# Patient Record
Sex: Female | Born: 1980 | Race: White | Hispanic: No | Marital: Married | Smoking: Former smoker
Health system: Southern US, Community
[De-identification: ages and names within clinical notes are randomized; demographics above are authoritative.]

## PROBLEM LIST (undated history)

## (undated) DIAGNOSIS — R160 Hepatomegaly, not elsewhere classified: Secondary | ICD-10-CM

## (undated) DIAGNOSIS — D649 Anemia, unspecified: Secondary | ICD-10-CM

## (undated) DIAGNOSIS — M47816 Spondylosis without myelopathy or radiculopathy, lumbar region: Secondary | ICD-10-CM

## (undated) DIAGNOSIS — M1990 Unspecified osteoarthritis, unspecified site: Secondary | ICD-10-CM

## (undated) DIAGNOSIS — D509 Iron deficiency anemia, unspecified: Secondary | ICD-10-CM

## (undated) DIAGNOSIS — Z8616 Personal history of COVID-19: Secondary | ICD-10-CM

## (undated) DIAGNOSIS — K219 Gastro-esophageal reflux disease without esophagitis: Secondary | ICD-10-CM

## (undated) DIAGNOSIS — Z9889 Other specified postprocedural states: Secondary | ICD-10-CM

## (undated) DIAGNOSIS — E282 Polycystic ovarian syndrome: Secondary | ICD-10-CM

## (undated) DIAGNOSIS — F17200 Nicotine dependence, unspecified, uncomplicated: Secondary | ICD-10-CM

## (undated) DIAGNOSIS — Z9289 Personal history of other medical treatment: Secondary | ICD-10-CM

## (undated) DIAGNOSIS — F419 Anxiety disorder, unspecified: Secondary | ICD-10-CM

## (undated) HISTORY — PX: TONSILECTOMY, ADENOIDECTOMY, BILATERAL MYRINGOTOMY AND TUBES: SHX2538

## (undated) HISTORY — PX: TONSILLECTOMY AND ADENOIDECTOMY: SHX28

## (undated) HISTORY — PX: CARPAL TUNNEL RELEASE: SHX101

## (undated) HISTORY — PX: VENTRAL HERNIA REPAIR: SHX424

## (undated) HISTORY — PX: APPENDECTOMY: SHX54

---

## 2010-10-18 DIAGNOSIS — Z8614 Personal history of Methicillin resistant Staphylococcus aureus infection: Secondary | ICD-10-CM

## 2010-10-18 HISTORY — DX: Personal history of Methicillin resistant Staphylococcus aureus infection: Z86.14

## 2018-02-14 ENCOUNTER — Encounter: Payer: Self-pay | Admitting: Emergency Medicine

## 2018-02-14 ENCOUNTER — Observation Stay
Admission: EM | Admit: 2018-02-14 | Discharge: 2018-02-15 | Disposition: A | Discharge status: Home / Self Care | Payer: BLUE CROSS/BLUE SHIELD | Attending: Internal Medicine | Admitting: Internal Medicine

## 2018-02-14 ENCOUNTER — Other Ambulatory Visit: Payer: Self-pay

## 2018-02-14 DIAGNOSIS — W5501XA Bitten by cat, initial encounter: Secondary | ICD-10-CM | POA: Diagnosis not present

## 2018-02-14 DIAGNOSIS — Y9389 Activity, other specified: Secondary | ICD-10-CM | POA: Insufficient documentation

## 2018-02-14 DIAGNOSIS — F17210 Nicotine dependence, cigarettes, uncomplicated: Secondary | ICD-10-CM | POA: Insufficient documentation

## 2018-02-14 DIAGNOSIS — L03011 Cellulitis of right finger: Secondary | ICD-10-CM | POA: Diagnosis not present

## 2018-02-14 DIAGNOSIS — S61258A Open bite of other finger without damage to nail, initial encounter: Secondary | ICD-10-CM

## 2018-02-14 DIAGNOSIS — S61250A Open bite of right index finger without damage to nail, initial encounter: Secondary | ICD-10-CM | POA: Diagnosis not present

## 2018-02-14 DIAGNOSIS — L089 Local infection of the skin and subcutaneous tissue, unspecified: Secondary | ICD-10-CM | POA: Diagnosis present

## 2018-02-14 DIAGNOSIS — Z23 Encounter for immunization: Secondary | ICD-10-CM | POA: Diagnosis not present

## 2018-02-14 HISTORY — DX: Nicotine dependence, unspecified, uncomplicated: F17.200

## 2018-02-14 NOTE — ED Triage Notes (Addendum)
Patient ambulatory to triage with steady gait, without difficulty or distress noted; pt reports her cat bit her left finger at noon while attempting to get her out of storm drain

## 2018-02-15 ENCOUNTER — Emergency Department: Payer: BLUE CROSS/BLUE SHIELD

## 2018-02-15 ENCOUNTER — Encounter: Payer: Self-pay | Admitting: Internal Medicine

## 2018-02-15 ENCOUNTER — Other Ambulatory Visit: Payer: Self-pay

## 2018-02-15 DIAGNOSIS — W5501XA Bitten by cat, initial encounter: Secondary | ICD-10-CM

## 2018-02-15 DIAGNOSIS — F17200 Nicotine dependence, unspecified, uncomplicated: Secondary | ICD-10-CM | POA: Insufficient documentation

## 2018-02-15 DIAGNOSIS — S61258A Open bite of other finger without damage to nail, initial encounter: Secondary | ICD-10-CM

## 2018-02-15 DIAGNOSIS — L089 Local infection of the skin and subcutaneous tissue, unspecified: Secondary | ICD-10-CM | POA: Diagnosis present

## 2018-02-15 LAB — BASIC METABOLIC PANEL
Anion gap: 7 (ref 5–15)
BUN: 12 mg/dL (ref 6–20)
CALCIUM: 8.8 mg/dL — AB (ref 8.9–10.3)
CHLORIDE: 104 mmol/L (ref 101–111)
CO2: 25 mmol/L (ref 22–32)
CREATININE: 0.43 mg/dL — AB (ref 0.44–1.00)
GFR calc Af Amer: >60 mL/min (ref >60)
GFR calc non Af Amer: >60 mL/min (ref >60)
Glucose, Bld: 88 mg/dL (ref 65–99)
Potassium: 3.8 mmol/L (ref 3.5–5.1)
SODIUM: 136 mmol/L (ref 135–145)

## 2018-02-15 LAB — CBC WITH DIFFERENTIAL/PLATELET
BASOS PCT: 1 %
Basophils Absolute: 0 10*3/uL (ref 0–0.1)
EOS ABS: 0.1 10*3/uL (ref 0–0.7)
EOS PCT: 1 %
HCT: 39.6 % (ref 35.0–47.0)
HEMOGLOBIN: 13.7 g/dL (ref 12.0–16.0)
LYMPHS ABS: 2.1 10*3/uL (ref 1.0–3.6)
Lymphocytes Relative: 27 %
MCH: 32 pg (ref 26.0–34.0)
MCHC: 34.5 g/dL (ref 32.0–36.0)
MCV: 92.6 fL (ref 80.0–100.0)
MONO ABS: 0.6 10*3/uL (ref 0.2–0.9)
MONOS PCT: 8 %
Neutro Abs: 5 10*3/uL (ref 1.4–6.5)
Neutrophils Relative %: 63 %
PLATELETS: 266 10*3/uL (ref 150–440)
RBC: 4.28 MIL/uL (ref 3.80–5.20)
RDW: 13.7 % (ref 11.5–14.5)
WBC: 7.7 10*3/uL (ref 3.6–11.0)

## 2018-02-15 IMAGING — DX DG FINGER INDEX 2+V*R*
3 series · 3 of 3 positions shown · non-contrast
Comparison: None.

CLINICAL DATA: Cat bite

EXAM:
RIGHT INDEX FINGER 2+V

[finger ap]
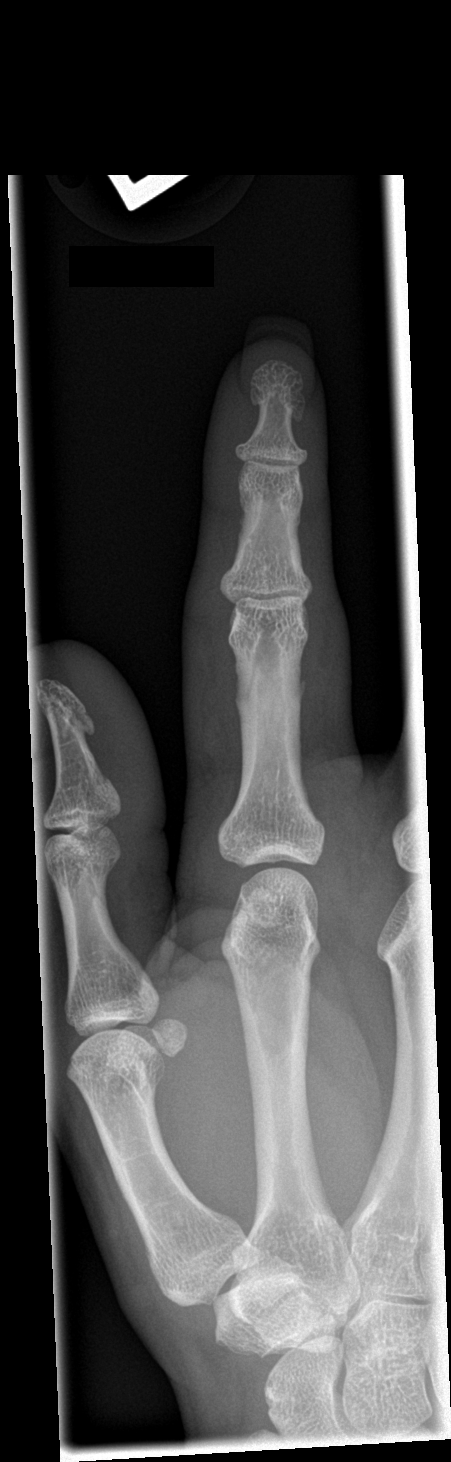

[finger obl]
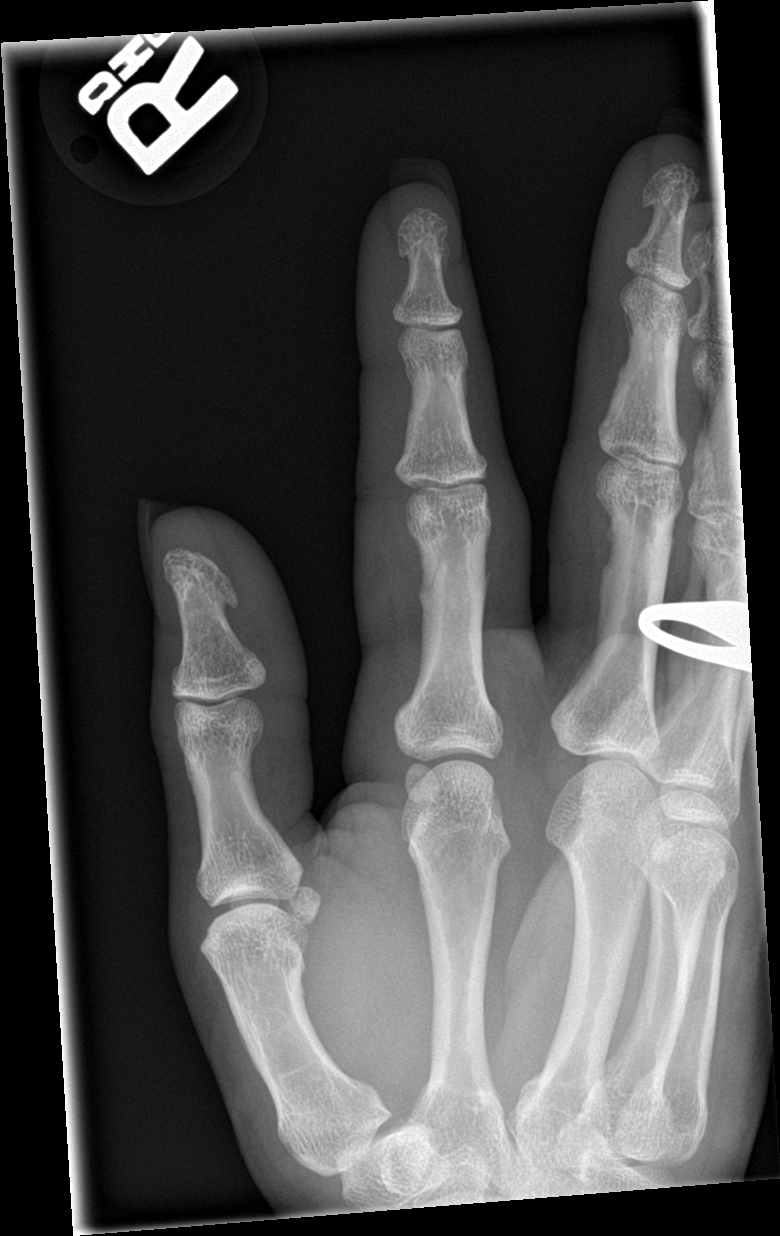

[finger lat]
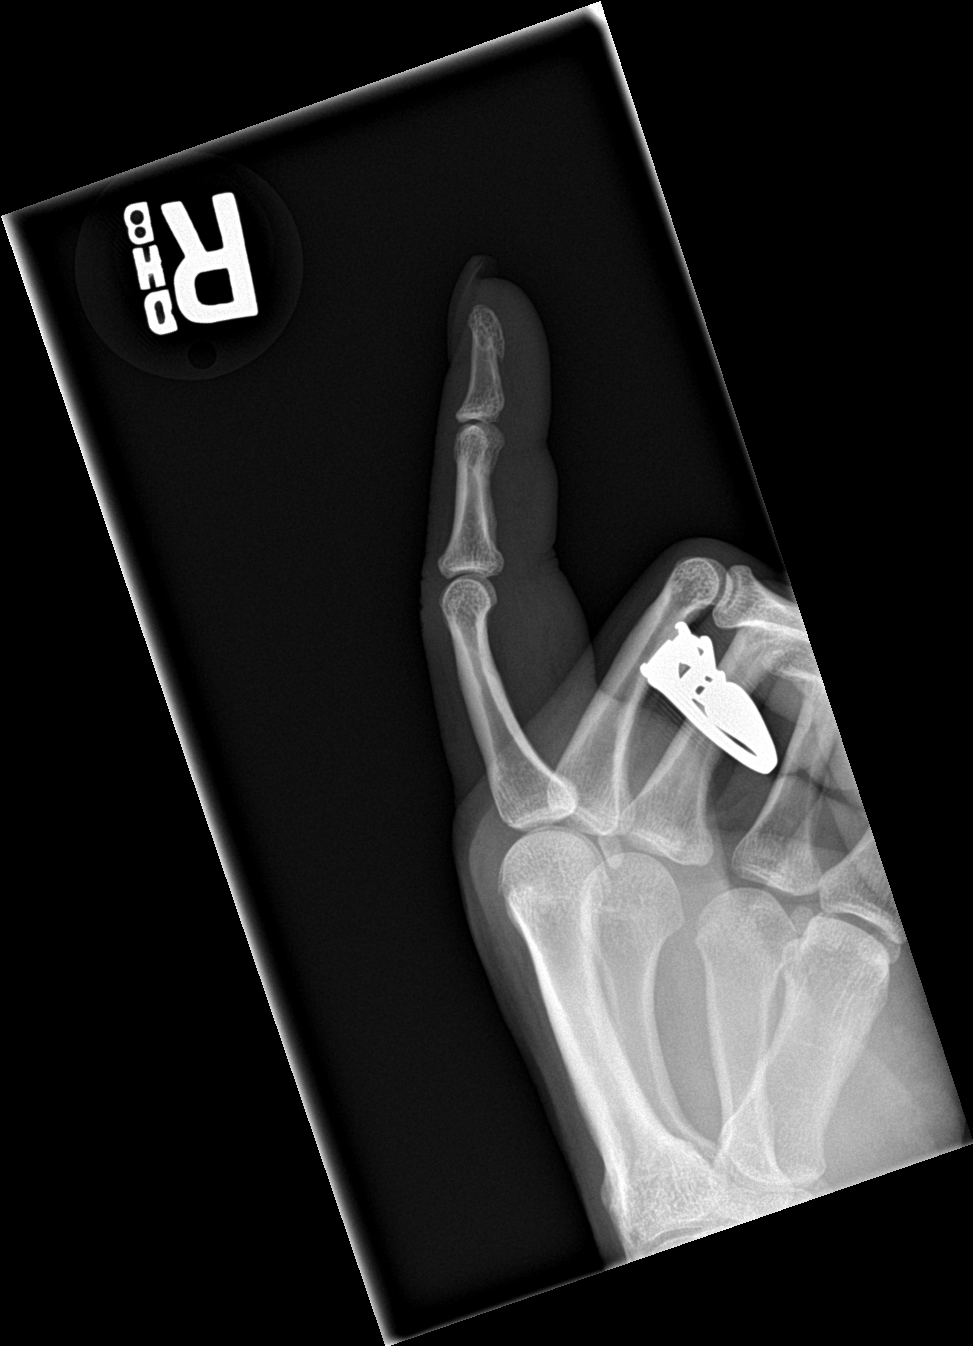

[3 of 3 positions shown; findings below may reference images not displayed]

FINDINGS: No fracture or malalignment. No radiopaque foreign body. Diffuse
soft tissue swelling
IMPRESSION: Diffuse soft tissue swelling.  No acute osseous abnormality

## 2018-02-15 MED ORDER — PIPERACILLIN-TAZOBACTAM 3.375 G IVPB
3.3750 g | Freq: Three times a day (TID) | INTRAVENOUS | Status: DC
  Administered 2018-02-15: 3.375 g via INTRAVENOUS
  Filled 2018-02-15: qty 50

## 2018-02-15 MED ORDER — SODIUM CHLORIDE 0.9 % IV SOLN
100.0000 mg | Freq: Two times a day (BID) | INTRAVENOUS | Status: DC
  Filled 2018-02-15 (×2): qty 100

## 2018-02-15 MED ORDER — IBUPROFEN 600 MG PO TABS
ORAL_TABLET | ORAL | Status: AC
  Filled 2018-02-15: qty 1

## 2018-02-15 MED ORDER — SENNOSIDES-DOCUSATE SODIUM 8.6-50 MG PO TABS: 1.0000 | ORAL_TABLET | Freq: Every evening | ORAL | Status: DC | PRN

## 2018-02-15 MED ORDER — IBUPROFEN 800 MG PO TABS
ORAL_TABLET | ORAL | Status: AC
  Filled 2018-02-15: qty 1

## 2018-02-15 MED ORDER — ACETAMINOPHEN 650 MG RE SUPP: 650.0000 mg | Freq: Four times a day (QID) | RECTAL | Status: DC | PRN

## 2018-02-15 MED ORDER — KETOROLAC TROMETHAMINE 15 MG/ML IJ SOLN
15.0000 mg | Freq: Three times a day (TID) | INTRAMUSCULAR | Status: DC | PRN
  Filled 2018-02-15: qty 1

## 2018-02-15 MED ORDER — ONDANSETRON HCL 4 MG PO TABS: 4.0000 mg | ORAL_TABLET | Freq: Four times a day (QID) | ORAL | Status: DC | PRN

## 2018-02-15 MED ORDER — PIPERACILLIN-TAZOBACTAM 3.375 G IVPB 30 MIN: 3.3750 g | Freq: Three times a day (TID) | INTRAVENOUS | Status: DC

## 2018-02-15 MED ORDER — DOXYCYCLINE HYCLATE 100 MG PO TABS
100.0000 mg | ORAL_TABLET | Freq: Two times a day (BID) | ORAL | Status: DC
  Administered 2018-02-15: 15:00:00 100 mg via ORAL
  Filled 2018-02-15 (×2): qty 1

## 2018-02-15 MED ORDER — DOXYCYCLINE HYCLATE 100 MG PO TABS
100.0000 mg | ORAL_TABLET | Freq: Once | ORAL | Status: AC
  Administered 2018-02-15: 100 mg via ORAL
  Filled 2018-02-15: qty 1

## 2018-02-15 MED ORDER — PIPERACILLIN-TAZOBACTAM 3.375 G IVPB 30 MIN
3.3750 g | Freq: Once | INTRAVENOUS | Status: AC
  Administered 2018-02-15: 3.375 g via INTRAVENOUS
  Filled 2018-02-15: qty 50

## 2018-02-15 MED ORDER — IBUPROFEN 600 MG PO TABS
600.0000 mg | ORAL_TABLET | Freq: Once | ORAL | Status: AC
  Administered 2018-02-15: 600 mg via ORAL

## 2018-02-15 MED ORDER — OXYCODONE-ACETAMINOPHEN 5-325 MG PO TABS: 1.0000 | ORAL_TABLET | ORAL | Status: DC | PRN

## 2018-02-15 MED ORDER — ACETAMINOPHEN 325 MG PO TABS: 650.0000 mg | ORAL_TABLET | Freq: Four times a day (QID) | ORAL | Status: DC | PRN

## 2018-02-15 MED ORDER — TETANUS-DIPHTH-ACELL PERTUSSIS 5-2.5-18.5 LF-MCG/0.5 IM SUSP
0.5000 mL | Freq: Once | INTRAMUSCULAR | Status: AC
Start: 2018-02-15 — End: 2018-02-15
  Administered 2018-02-15: 0.5 mL via INTRAMUSCULAR
  Filled 2018-02-15: qty 0.5

## 2018-02-15 MED ORDER — AMOXICILLIN-POT CLAVULANATE 875-125 MG PO TABS: 1.0000 | ORAL_TABLET | Freq: Two times a day (BID) | ORAL | 0 refills | Status: AC

## 2018-02-15 MED ORDER — KETOROLAC TROMETHAMINE 30 MG/ML IJ SOLN: 30.0000 mg | Freq: Three times a day (TID) | INTRAMUSCULAR | Status: DC | PRN

## 2018-02-15 MED ORDER — ONDANSETRON HCL 4 MG/2ML IJ SOLN: 4.0000 mg | Freq: Four times a day (QID) | INTRAMUSCULAR | Status: DC | PRN

## 2018-02-15 MED ORDER — BISACODYL 5 MG PO TBEC: 5.0000 mg | DELAYED_RELEASE_TABLET | Freq: Every day | ORAL | Status: DC | PRN

## 2018-02-15 MED ORDER — OXYCODONE-ACETAMINOPHEN 5-325 MG PO TABS: 1.0000 | ORAL_TABLET | ORAL | 0 refills | Status: DC | PRN

## 2018-02-15 MED ORDER — ENOXAPARIN SODIUM 40 MG/0.4ML ~~LOC~~ SOLN: 40.0000 mg | SUBCUTANEOUS | Status: DC

## 2018-02-15 NOTE — H&P (Signed)
[facility] [place] [facility]   PATIENT NAME: Amy Maxwell    MR#:  [id number]  DATE OF BIRTH:  1981-08-25  DATE OF ADMISSION:  02/14/2018  PRIMARY CARE PHYSICIAN: Patient, No Pcp Per   REQUESTING/REFERRING PHYSICIAN: Loleta Rose, MD  CHIEF COMPLAINT:   Chief Complaint  Patient presents with  . Animal Bite    HISTORY OF PRESENT ILLNESS:  Amy Maxwell  is a 37 y.o. female with a known history of tobacco use D/O who p/w 1d Hx cat bite. Pt states that her female cat is an indoor rescue, and does not deal with stressful situations (i.e. noise, lights, unfamililar people/environments, outdoors, etc.) well. Her cat escaped the house on Sunday (02/12/2018). The pt was contacted by a neighbor, who had seen the cat in a storm drain. The pt and her husband attempted to get the cat out of the drain by having her husband stand on one end and bang on the drain grate to make noise, while she stood on the other end to prevent escape. The cat ran towards her, and she attempted to grab the cat, resulting in a bite to the base of the R index finger. She did manage to get the cat back inside the house. The cat is apparently up to date on all of her vaccinations. The pt sustained two small puncture wounds, one on either side of the proximal phalanx of the index finger of the R hand. The pt states she washed the bite and applied alcohol, but she noticed swelling over the course of the afternoon/evening. She denies purulent discharge, but endorses serosanguinous drainage. She endorses pain, TTP, and decreased AROM/PROM of the proximal interphalangeal joint of the R index finger (as well as pain w/ AROM/PROM). She denies fever, chills, diaphoresis, night sweats or rigors. She sent a picture of her finger to a family member who is a Engineer, civil (consulting), who advised pt to go to ED for IV ABx. Pt is healthy, well-appearing and in no acute distress, and is otherwise w/o complaint. She denies N/V/D/AP, CP,  SOB, cough, hemoptysis, wheezing, palpitations, HA, vertigo, blurred vision, LH/LOC, urinary symptoms. Pt is right-handed.  PAST MEDICAL HISTORY:   Past Medical History:  Diagnosis Date  . Tobacco use disorder      Tobacco use D/O.  PAST SURGICAL HISTORY:   Past Surgical History:  Procedure Laterality Date  . CARPAL TUNNEL RELEASE Right   . CESAREAN SECTION    . VENTRAL HERNIA REPAIR       C-section x3, L CTR, ventral hernia repair.  SOCIAL HISTORY:   Social History   Tobacco Use  . Smoking status: Current Every Day Smoker    Packs/day: 0.50    Years: 2.00    Pack years: 1.00    Types: Cigarettes  . Smokeless tobacco: Never Used  Substance Use Topics  . Alcohol use: Not Currently   (+) smoker, < 1/2ppd x~20yrs. (+) occasional social EtOH, (-) illicit drug use. (-) recent travel, (-) sick contacts. Lives in house w/ husband, 3 daughters, 4 cats, 1 dog. 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  FAMILY HISTORY:   Family History  Problem Relation Age of Onset  . Cirrhosis Father   . Alcoholism Father      Mother deceased of unknown causes. Father deceased 2/2 EtOH/cirrhosis. 6 brothers + 1 sister, alive & healthy. 3 daughters, alive & healthy.  DRUG ALLERGIES:  No Known Allergies  REVIEW OF SYSTEMS:   Review of  Systems  Constitutional: Negative for chills, diaphoresis, fever, malaise/fatigue and weight loss.  HENT: Negative for congestion, ear pain, hearing loss, nosebleeds, sinus pain, sore throat and tinnitus.   Eyes: Negative for blurred vision, double vision and photophobia.  Respiratory: Negative for cough, hemoptysis, sputum production, shortness of breath and wheezing.   Cardiovascular: Negative for chest pain, palpitations, orthopnea, claudication, leg swelling and PND.  Gastrointestinal: Negative for abdominal pain, blood in stool, constipation, diarrhea, heartburn, melena, nausea and vomiting.  Genitourinary: Negative for dysuria, flank pain, frequency,  hematuria and urgency.  Musculoskeletal: Positive for joint pain (+) pain at proximal interphalangeal joint of R index finger (bite location). Negative for back pain, falls, myalgias and neck pain.  Skin: Negative for itching and rash.  Neurological: Negative for dizziness, tingling, tremors, sensory change, speech change, focal weakness, seizures, loss of consciousness, weakness and headaches.    MEDICATIONS AT HOME:   Prior to Admission medications   Not on File      VITAL SIGNS:  Blood pressure 123/80, pulse 87, temperature 97.9 F (36.6 C), temperature source Oral, resp. rate 14, height  (1.626 m), weight 87.5 kg (193 lb), last menstrual period 01/25/2018, SpO2 96 %.  PHYSICAL EXAMINATION:  Physical Exam  Constitutional: She is oriented to person, place, and time. She appears well-developed and well-nourished. She is active and cooperative.  Non-toxic appearance. She does not have a sickly appearance. She does not appear ill. No distress.  HENT:  Head: Normocephalic and atraumatic.  Mouth/Throat: No oropharyngeal exudate.  Eyes: Pupils are equal, round, and reactive to light. Conjunctivae, EOM and lids are normal. No scleral icterus. Right pupil is round and reactive. Left pupil is round and reactive. Pupils are equal.  Neck: Normal range of motion. Neck supple. No JVD present. No thyromegaly present.  Cardiovascular: Normal rate, regular rhythm, S1 normal, S2 normal and normal heart sounds.  No extrasystoles are present. Exam reveals no gallop, no S3, no S4, no distant heart sounds and no friction rub.  No murmur heard. Pulmonary/Chest: Effort normal and breath sounds normal. No accessory muscle usage or stridor. No apnea, no tachypnea and no bradypnea. No respiratory distress. She has no decreased breath sounds. She has no wheezes. She has no rhonchi. She has no rales.  Abdominal: Soft. Bowel sounds are normal. She exhibits no distension. There is no tenderness. There is no  rebound and no guarding.  Musculoskeletal:  (+) R hand index finger w/ puncture marks immediately distal to first interphalangeal joint and medial + lateral overlying proximal phalanx. (-) boggy/tender joint. (-) tracking. (+) decreased AROM/PROM at proximal interphalangeal joint R index finger, (+) pain w/ AROM/PROM, (+) edema/erythema/TTP. (-) discharge.  Lymphadenopathy:    She has no cervical adenopathy.  Neurological: She is alert and oriented to person, place, and time. She is not disoriented.  Skin: Skin is warm and dry. She is not diaphoretic. No erythema.  Psychiatric: She has a normal mood and affect. Her speech is normal and behavior is normal. Judgment and thought content normal. Cognition and memory are normal.   LABORATORY PANEL:   CBC Recent Labs  Lab 02/15/18 0038  WBC 7.7  HGB 13.7  HCT 39.6  PLT 266   ------------------------------------------------------------------------------------------------------------------  Chemistries  Recent Labs  Lab 02/15/18 0038  NA 136  K 3.8  CL 104  CO2 25  GLUCOSE 88  BUN 12  CREATININE 0.43*  CALCIUM 8.8*   ------------------------------------------------------------------------------------------------------------------  Cardiac Enzymes No results for input(s): TROPONINI in the last  168 hours. ------------------------------------------------------------------------------------------------------------------  RADIOLOGY:  Dg Finger Index Right  Result Date: 02/15/2018 CLINICAL DATA:  Cat bite EXAM: RIGHT INDEX FINGER 2+V COMPARISON:  None. FINDINGS: No fracture or malalignment. No radiopaque foreign body. Diffuse soft tissue swelling IMPRESSION: Diffuse soft tissue swelling.  No acute osseous abnormality Electronically Signed   By: Jasmine Pang M.D.   On: 02/15/2018 01:09   IMPRESSION AND PLAN:   A/P: 12F cat bite R index finger.  1.) Cat bite: Pt p/w 1d Hx cat bite to R index finger, (+) swelling/pain. Appears to  have superficial infxn of skin/soft tissue, w/o active involvement of finger joints. Afebrile, (-) leukocytosis, SIRS (-). XR demonstrates diffuse soft tissue swelling w/o acute osseous abnl. Started on Zosyn + Doxycycline in ED, cont'd. Pain ctrl, symptomatic mgmt. Observation to monitor for joint involvement, and to re-evaluate for improvement of swelling and functional ability w/ R hand prior to D/C.  2.) FEN/GI: Regular diet.  3.) DVT PPx: Lovenox  SQ qD.  4.) Code status: Full code.  5.) Disposition: Observation, pt expected to stay < 2 midnights.   All the records are reviewed and case discussed with ED provider. Management plans discussed with the patient, family and they are in agreement.  CODE STATUS: Full code.  TOTAL TIME TAKING CARE OF THIS PATIENT: 60 minutes.    Barbaraann Rondo M.D on 02/15/2018 at 2:30 AM  Between 7am to 6pm - Pager - [phone]  After 6pm go to [email or web address] - Social research officer, government  [facility] [place] [facility]  Office  [phone]  CC: Primary care physician; Patient, No Pcp Per   Note: This dictation was prepared with Dragon dictation along with smaller phrase technology. Any transcriptional errors that result from this process are unintentional.

## 2018-02-15 NOTE — ED Notes (Signed)
Admitting MD at bedside.

## 2018-02-15 NOTE — Plan of Care (Signed)
  Problem: Health Behavior/Discharge Planning: Goal: Ability to manage health-related needs will improve Outcome: Progressing   Problem: Clinical Measurements: Goal: Ability to maintain clinical measurements within normal limits will improve Outcome: Progressing Goal: Will remain free from infection Outcome: Progressing   Problem: Coping: Goal: Level of anxiety will decrease Outcome: Progressing   Problem: Pain Managment: Goal: General experience of comfort will improve Outcome: Progressing   Problem: Skin Integrity: Goal: Risk for impaired skin integrity will decrease Outcome: Progressing

## 2018-02-15 NOTE — ED Provider Notes (Signed)
[facility] Emergency Department Provider Note  ____________________________________________   First MD Initiated Contact with Patient 02/15/18 0021     (approximate)  I have reviewed the triage vital signs and the nursing notes.   HISTORY  Chief Complaint Animal Bite    HPI Amy Maxwell is a 37 y.o. female with no chronic medical history who presents for evaluation of a cat bite to her right index finger (right-hand-dominant) with significant amount of swelling and tenderness that is developed rapidly over the last 12 hours.  She reports that it is her cat that bit her on the finger and that the cat is up-to-date on all its vaccinations.  She cleaned the wound a couple of times but was concerned when it began to swell and become increasingly tender.  It has increased in size significantly over the last 12 hours and she is now having difficulty flexing the finger more than just a little bit.  She feels like there is some pain that is now radiating into her hand although her hand has no swelling or redness.  She denies fever/chills, chest pain, shortness of breath, nausea, vomiting, and abdominal pain.  The symptoms were acute in onset and have rapidly gotten worse and nothing in particular is making it better or worse.  Past Medical History:  Diagnosis Date  . Tobacco use disorder     Patient Active Problem List   Diagnosis Date Noted  . Infected cat bite of index finger 02/15/2018  . Tobacco use disorder 02/15/2018    Past Surgical History:  Procedure Laterality Date  . CARPAL TUNNEL RELEASE Right   . CESAREAN SECTION    . VENTRAL HERNIA REPAIR      Prior to Admission medications   Not on File    Allergies Patient has no known allergies.  Family History  Problem Relation Age of Onset  . Cirrhosis Father   . Alcoholism Father     Social History Social History   Tobacco Use  . Smoking status: Current Every Day Smoker    Packs/day: 0.50     Years: 2.00    Pack years: 1.00    Types: Cigarettes  . Smokeless tobacco: Never Used  Substance Use Topics  . Alcohol use: Not Currently  . Drug use: Never    Review of Systems Constitutional: No fever/chills Eyes: No visual changes. ENT: No sore throat. Cardiovascular: Denies chest pain. Respiratory: Denies shortness of breath. Gastrointestinal: No abdominal pain.  No nausea, no vomiting.  No diarrhea.  No constipation. Genitourinary: Negative for dysuria. Musculoskeletal: Cat bite to dominant right index finger with swelling and pain as documented above. Negative for neck pain.  Negative for back pain. Integumentary: Negative for rash. Neurological: Negative for headaches, focal weakness or numbness.   ____________________________________________   PHYSICAL EXAM:  VITAL SIGNS: ED Triage Vitals  Enc Vitals Group     BP 02/14/18 2203 137/87     Pulse Rate 02/14/18 2203 84     Resp 02/14/18 2203 18     Temp 02/14/18 2203 98.1 F (36.7 C)     Temp Source 02/14/18 2203 Oral     SpO2 02/14/18 2203 98 %     Weight 02/14/18 2201 87.5 kg (193 lb)     Height 02/14/18 2201 1.626 m ( )     Head Circumference --      Peak Flow --      Pain Score 02/14/18 2201 3     Pain Loc --  Pain Edu? --      Excl. in GC? --     Constitutional: Alert and oriented. Well appearing and in no acute distress. Eyes: Conjunctivae are normal.  Head: Atraumatic. Mouth/Throat: Mucous membranes are moist. Neck: No stridor.  No meningeal signs.   Cardiovascular: Normal rate, regular rhythm. Good peripheral circulation. Grossly normal heart sounds. Respiratory: Normal respiratory effort.  No retractions. Lungs CTAB. Gastrointestinal: Soft and nontender. No distention.  Musculoskeletal: There is a puncture wound to the medial and lateral aspect of the proximal phalanx of the right index finger.  There is fusiform swelling of the digit and the patient is holding the digit in very slight  flexion.  There is pain and tenderness with flexion or extension of the finger.  No significant erythema but there is tenderness to palpation of the proximal phalanx and slight tenderness to palpation of the MCP but no tenderness to palpation along the tendon sheath into the hand and there is no swelling of the hand itself.  The patient's extremities are otherwise unremarkable. Neurologic:  Normal speech and language. No gross focal neurologic deficits are appreciated.  Skin:  Skin is warm, dry and intact. No rash noted. Psychiatric: Mood and affect are normal. Speech and behavior are normal.  ____________________________________________   LABS (all labs ordered are listed, but only abnormal results are displayed)  Labs Reviewed  BASIC METABOLIC PANEL - Abnormal; Notable for the following components:      Result Value   Creatinine, Ser 0.43 (*)    Calcium 8.8 (*)    All other components within normal limits  CBC WITH DIFFERENTIAL/PLATELET  HIV ANTIBODY (ROUTINE TESTING)   ____________________________________________  EKG  No indication for EKG ____________________________________________  RADIOLOGY Marylou Mccoy, personally viewed and evaluated these images (plain radiographs) as part of my medical decision making, as well as reviewing the written report by the radiologist.  ED MD interpretation: Soft tissue swelling but no bony abnormalities and no foreign bodies are visible on the radiograph.  Official radiology report(s): Dg Finger Index Right  Result Date: 02/15/2018 CLINICAL DATA:  Cat bite EXAM: RIGHT INDEX FINGER 2+V COMPARISON:  None. FINDINGS: No fracture or malalignment. No radiopaque foreign body. Diffuse soft tissue swelling IMPRESSION: Diffuse soft tissue swelling.  No acute osseous abnormality Electronically Signed   By: Jasmine Pang M.D.   On: 02/15/2018 01:09    ____________________________________________   PROCEDURES  Critical Care performed:  No   Procedure(s) performed:   Procedures   ____________________________________________   INITIAL IMPRESSION / ASSESSMENT AND PLAN / ED COURSE  As part of my medical decision making, I reviewed the following data within the electronic MEDICAL RECORD NUMBER Nursing notes reviewed and incorporated, Labs reviewed  and Discussed with admitting physician     Differential diagnosis includes, but is not limited to, cellulitis with both skin flora such as strep and staph species but also oral flora from her cat, tenosynovitis, necrotizing fasciitis.  The patient is nontoxic in appearance but I am very concerned about the fact that in only 12 hours after the bite, the patient has a significant amount of pain and swelling and range of motion limitation of the right index finger on her dominant hand.  She has at least 2 Kanavel signs (finger held in slight flexion and fusiform swelling), and the pain along the tendon sheath is a little bit more difficult to assess because the finger is painful but I do not believe that she qualifies yet as flexor tenosynovitis.  However I feel she is a great risk of it, and the morbidity of an incompletely treated infection (for example if I were to only prescribe outpatient antibiotics) could be severe given how rapidly it is spreading.  I will check basic lab work and treat her empirically with Zosyn 3.375 g IV as well as starting her on doxycycline 100 mg by mouth for MRSA coverage.  However I think that she will benefit from hospital observation with continued IV antibiotics and orthopedics consult later today/tomorrow.  If her finger is looking well and not increasing in size, she likely will be able to be discharged on Augmentin and doxycycline (or Bactrim for MRSA coverage).  I will discussed the case with the hospitalist after the antibiotics are back.  I did discuss the possibility of IV antibiotics now, discharge with oral antibiotics, and close follow-up with the  patient, but she agrees that an aggressive treatment plan would be her preference as well.  Clinical Course as of Feb 15 241  Wed Feb 15, 2018  0055 WBC: 7.7 [CF]  0055 Temp: 98.1 F (36.7 C) [CF]  0135 Discussed case with hospitalist.  Will admit for observation, IV antibiotics, and ortho consult to make sure she is improving and not developing into flexor tenosynovitis.   [CF]    Clinical Course User Index [CF] Loleta Rose, MD    ____________________________________________  FINAL CLINICAL IMPRESSION(S) / ED DIAGNOSES  Final diagnoses:  Cat bite, initial encounter  Cellulitis of right index finger     MEDICATIONS GIVEN DURING THIS VISIT:  Medications  senna-docusate (Senokot-S) tablet 1 tablet (has no administration in time range)  bisacodyl (DULCOLAX) EC tablet 5 mg (has no administration in time range)  ondansetron (ZOFRAN) tablet 4 mg (has no administration in time range)    Or  ondansetron (ZOFRAN) injection 4 mg (has no administration in time range)  enoxaparin (LOVENOX) injection 40 mg (has no administration in time range)  acetaminophen (TYLENOL) tablet 650 mg (has no administration in time range)    Or  acetaminophen (TYLENOL) suppository 650 mg (has no administration in time range)  piperacillin-tazobactam (ZOSYN) IVPB 3.375 g (has no administration in time range)  doxycycline (VIBRAMYCIN) 100 mg in sodium chloride 0.9 % 250 mL IVPB (has no administration in time range)  ketorolac (TORADOL) 15 MG/ML injection 15 mg (has no administration in time range)    Followed by  ketorolac (TORADOL) 30 MG/ML injection 30 mg (has no administration in time range)  oxyCODONE-acetaminophen (PERCOCET/ROXICET) 5-325 MG per tablet 1 tablet (has no administration in time range)  piperacillin-tazobactam (ZOSYN) IVPB 3.375 g (0 g Intravenous Stopped 02/15/18 0121)  doxycycline (VIBRA-TABS) tablet 100 mg (100 mg Oral Given 02/15/18 0050)  ibuprofen (ADVIL,MOTRIN) tablet 600 mg (600 mg  Oral Given 02/15/18 0050)  Tdap (BOOSTRIX) injection 0.5 mL (0.5 mLs Intramuscular Given 02/15/18 0202)     ED Discharge Orders    None       Note:  This document was prepared using Dragon voice recognition software and may include unintentional dictation errors.    Loleta Rose, MD 02/15/18 [phone]

## 2018-02-15 NOTE — Progress Notes (Signed)
Pt VS stable. A&Ox4. Discharge instructions and new medication education given to patient. 2 prescriptions given to patient. IV removed. Pt to discharge home. Instructed to follow up with PCP.  Kinnie Scales, LPN

## 2018-02-16 LAB — HIV ANTIBODY (ROUTINE TESTING W REFLEX): HIV SCREEN 4TH GENERATION: NONREACTIVE

## 2018-02-22 NOTE — Discharge Summary (Signed)
[facility] [place] [facility]   PATIENT NAME: Amy Maxwell    MR#:  [id number]  DATE OF BIRTH:  1980/11/02  DATE OF ADMISSION:  02/14/2018 ADMITTING PHYSICIAN: Barbaraann Rondo, MD  DATE OF DISCHARGE: 02/15/2018  4:45 PM  PRIMARY CARE PHYSICIAN: Patient, No Pcp Per    ADMISSION DIAGNOSIS:  Cat bite, initial encounter [W55.01XA] Cellulitis of right index finger [L03.011]  DISCHARGE DIAGNOSIS:  Active Problems:   Infected cat bite of index finger   SECONDARY DIAGNOSIS:   Past Medical History:  Diagnosis Date  . Tobacco use disorder     HOSPITAL COURSE:   Admitted for cat bite and swelling on finger- after one dose IV Abx, felt much better so d/c on oral Abx, advised to follow with PMD.  DISCHARGE CONDITIONS:   Stable.  CONSULTS OBTAINED:  Treatment Team:  Barbaraann Rondo, MD  DRUG ALLERGIES:  No Known Allergies  DISCHARGE MEDICATIONS:   Allergies as of 02/15/2018   No Known Allergies     Medication List    STOP taking these medications   multivitamin with minerals Tabs tablet     TAKE these medications   oxyCODONE-acetaminophen 5-325 MG tablet Commonly known as:  PERCOCET/ROXICET Take 1 tablet by mouth every 4 (four) hours as needed for severe pain.     ASK your doctor about these medications   amoxicillin-clavulanate 875-125 MG tablet Commonly known as:  AUGMENTIN Take 1 tablet by mouth every 12 (twelve) hours for 5 days. Ask about: Should I take this medication?        DISCHARGE INSTRUCTIONS:    Follow with PMD in 1-2 weeks.  If you experience worsening of your admission symptoms, develop shortness of breath, life threatening emergency, suicidal or homicidal thoughts you must seek medical attention immediately by calling 911 or calling your MD immediately  if symptoms less severe.  You Must read complete instructions/literature along with all the possible adverse reactions/side effects for all the  Medicines you take and that have been prescribed to you. Take any new Medicines after you have completely understood and accept all the possible adverse reactions/side effects.   Please note  You were cared for by a hospitalist during your hospital stay. If you have any questions about your discharge medications or the care you received while you were in the hospital after you are discharged, you can call the unit and asked to speak with the hospitalist on call if the hospitalist that took care of you is not available. Once you are discharged, your primary care physician will handle any further medical issues. Please note that NO REFILLS for any discharge medications will be authorized once you are discharged, as it is imperative that you return to your primary care physician (or establish a relationship with a primary care physician if you do not have one) for your aftercare needs so that they can reassess your need for medications and monitor your lab values.    Today   CHIEF COMPLAINT:   Chief Complaint  Patient presents with  . Animal Bite    HISTORY OF PRESENT ILLNESS:  Amy Maxwell  is a 37 y.o. female with a known history of tobacco use D/O who p/w 1d Hx cat bite. Pt states that her female cat is an indoor rescue, and does not deal with stressful situations (i.e. noise, lights, unfamililar people/environments, outdoors, etc.) well. Her cat escaped the house on Sunday (02/12/2018). The pt was contacted by a neighbor, who had seen  the cat in a storm drain. The pt and her husband attempted to get the cat out of the drain by having her husband stand on one end and bang on the drain grate to make noise, while she stood on the other end to prevent escape. The cat ran towards her, and she attempted to grab the cat, resulting in a bite to the base of the R index finger. She did manage to get the cat back inside the house. The cat is apparently up to date on all of her vaccinations. The pt sustained  two small puncture wounds, one on either side of the proximal phalanx of the index finger of the R hand. The pt states she washed the bite and applied alcohol, but she noticed swelling over the course of the afternoon/evening. She denies purulent discharge, but endorses serosanguinous drainage. She endorses pain, TTP, and decreased AROM/PROM of the proximal interphalangeal joint of the R index finger (as well as pain w/ AROM/PROM). She denies fever, chills, diaphoresis, night sweats or rigors. She sent a picture of her finger to a family member who is a Engineer, civil (consulting), who advised pt to go to ED for IV ABx. Pt is healthy, well-appearing and in no acute distress, and is otherwise w/o complaint. She denies N/V/D/AP, CP, SOB, cough, hemoptysis, wheezing, palpitations, HA, vertigo, blurred vision, LH/LOC, urinary symptoms. Pt is right-handed.   VITAL SIGNS:  Blood pressure 104/62, pulse 77, temperature 98.5 F (36.9 C), temperature source Oral, resp. rate (!) 24, height  (1.626 m), weight 90.9 kg (200 lb 8 oz), last menstrual period 01/25/2018, SpO2 98 %.  I/O:  No intake or output data in the 24 hours ending 02/22/18 1719  PHYSICAL EXAMINATION:   Constitutional: Negative for chills, diaphoresis, fever, malaise/fatigue and weight loss.  HENT: Negative for congestion, ear pain, hearing loss, nosebleeds, sinus pain, sore throat and tinnitus.   Eyes: Negative for blurred vision, double vision and photophobia.  Respiratory: Negative for cough, hemoptysis, sputum production, shortness of breath and wheezing.   Cardiovascular: Negative for chest pain, palpitations, orthopnea, claudication, leg swelling and PND.  Gastrointestinal: Negative for abdominal pain, blood in stool, constipation, diarrhea, heartburn, melena, nausea and vomiting.  Genitourinary: Negative for dysuria, flank pain, frequency, hematuria and urgency.  Musculoskeletal: Positive for joint pain (+) pain at proximal interphalangeal joint of R  index finger (bite location). Negative for back pain, falls, myalgias and neck pain.  Skin: Negative for itching and rash.  Neurological: Negative for dizziness, tingling, tremors, sensory change, speech change, focal weakness, seizures, loss of consciousness, weakness and headaches.      DATA REVIEW:   CBC No results for input(s): WBC, HGB, HCT, PLT in the last 168 hours.  Chemistries  No results for input(s): NA, K, CL, CO2, GLUCOSE, BUN, CREATININE, CALCIUM, MG, AST, ALT, ALKPHOS, BILITOT in the last 168 hours.  Invalid input(s): GFRCGP  Cardiac Enzymes No results for input(s): TROPONINI in the last 168 hours.  Microbiology Results  No results found for this or any previous visit.  RADIOLOGY:  No results found.  EKG:  No orders found for this or any previous visit.    Management plans discussed with the patient, family and they are in agreement.  CODE STATUS:  Code Status History    Date Active Date Inactive Code Status Order ID Comments User Context   02/15/2018 0223 02/15/2018 1945 Full Code [id number]  Barbaraann Rondo, MD Inpatient      TOTAL TIME TAKING CARE OF THIS  PATIENT: 35 minutes.    Altamese Dilling M.D on 02/22/2018 at 5:19 PM  Between 7am to 6pm - Pager - [phone]  After 6pm go to [email or web address] - password [email or web address]  [facility]  Office  [phone]  CC: Primary care physician; Patient, No Pcp Per   Note: This dictation was prepared with Dragon dictation along with smaller phrase technology. Any transcriptional errors that result from this process are unintentional.

## 2020-10-18 DIAGNOSIS — I81 Portal vein thrombosis: Secondary | ICD-10-CM

## 2020-10-18 HISTORY — DX: Portal vein thrombosis: I81

## 2021-03-10 ENCOUNTER — Other Ambulatory Visit: Payer: Self-pay | Admitting: Family Medicine

## 2021-03-10 DIAGNOSIS — M5416 Radiculopathy, lumbar region: Secondary | ICD-10-CM

## 2021-03-12 ENCOUNTER — Other Ambulatory Visit: Payer: Self-pay

## 2021-03-12 ENCOUNTER — Ambulatory Visit
Admission: RE | Admit: 2021-03-12 | Discharge: 2021-03-12 | Disposition: A | Discharge status: Home / Self Care | Payer: BC Managed Care – PPO | Source: Ambulatory Visit | Attending: Family Medicine | Admitting: Family Medicine

## 2021-03-12 DIAGNOSIS — M5416 Radiculopathy, lumbar region: Secondary | ICD-10-CM

## 2021-03-12 IMAGING — MR MR LUMBAR SPINE W/O CM
4 of 5 series · 18 of 48 positions shown · non-contrast
Comparison: None.

CLINICAL DATA: Lumbar radiculopathy; technologist note states low
back pain, right leg pain and weakness

EXAM:
MRI LUMBAR SPINE WITHOUT CONTRAST
TECHNIQUE: Multiplanar, multisequence MR imaging of the lumbar spine was
performed. No intravenous contrast was administered.

[Series 5: T2 · sagittal · 4.0mm · 0.73mm/px · 5 of 15 slices shown (1 of 2)]
[im 1/15]
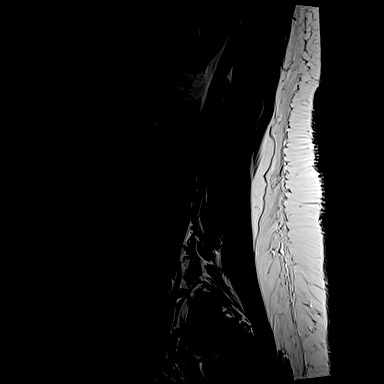
[im 4/15]
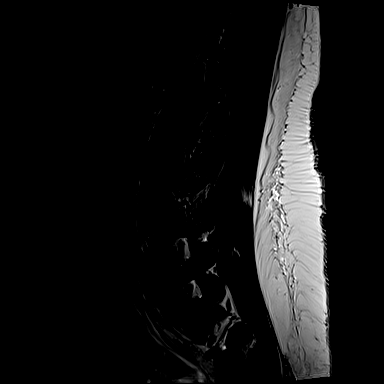
[im 8/15]
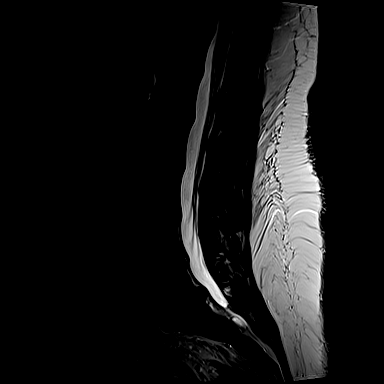
[im 11/15]
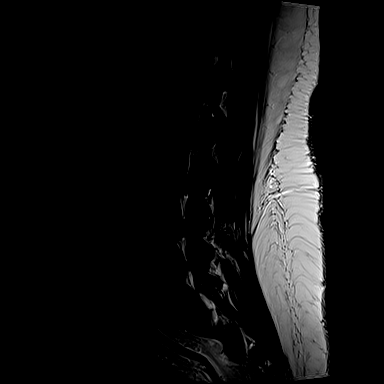
[im 15/15]
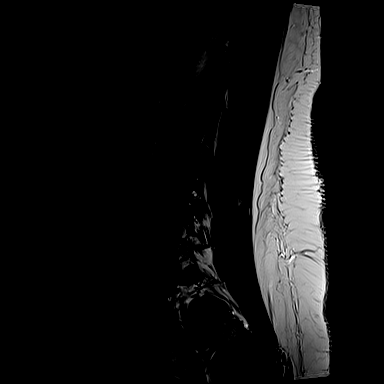

[Series 6: T1 · sagittal · 4.0mm · 0.73mm/px · 3 of 15 slices shown (1 of 2)]
[im 1/15]
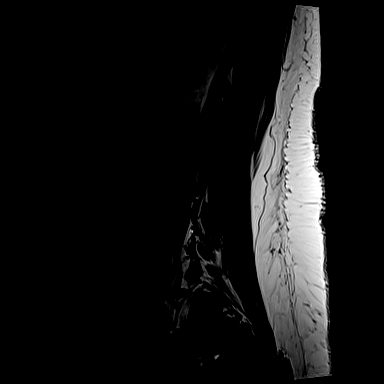
[im 8/15]
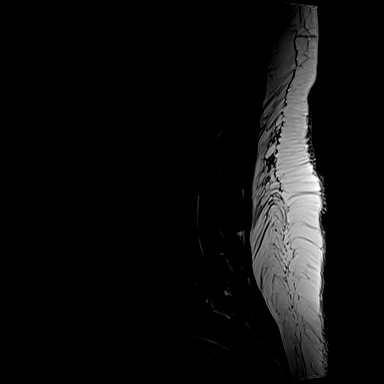
[im 15/15]
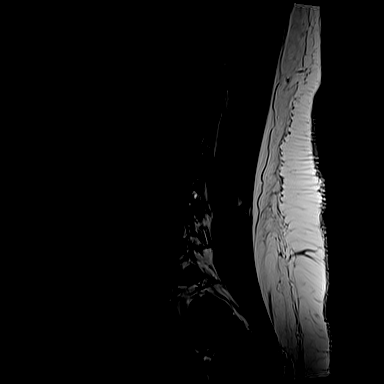

[Series 12: T2 · axial · 4.0mm · 0.28mm/px · z∈[-141,+43]mm · 7 of 42 slices shown (2 of 2)]
[im 3/42]
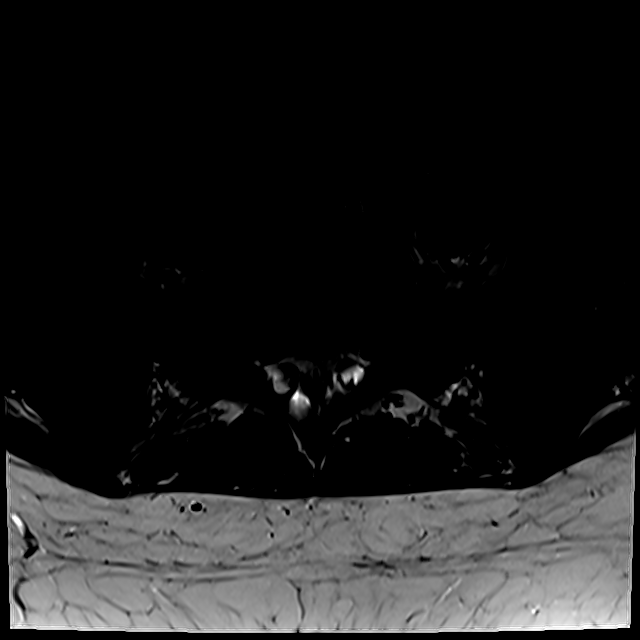
[im 6/42]
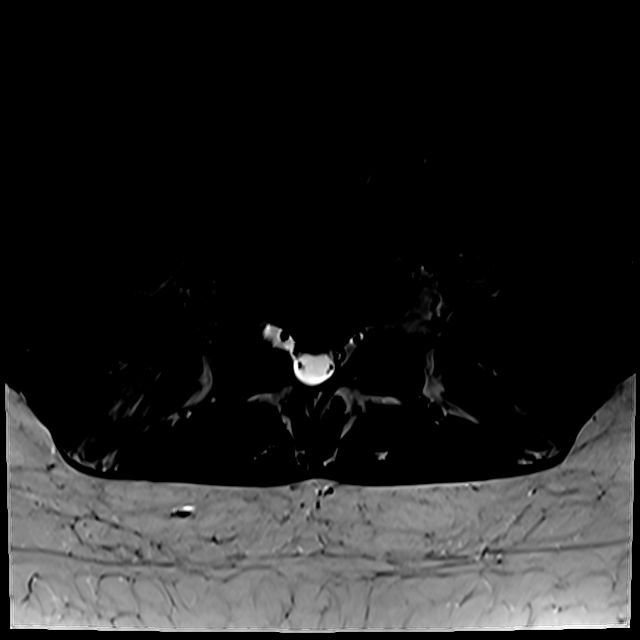
[im 9/42]
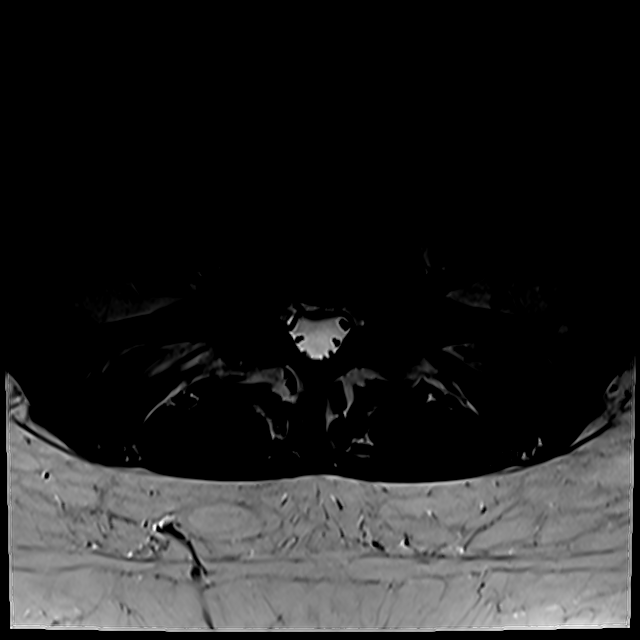
[im 14/42]
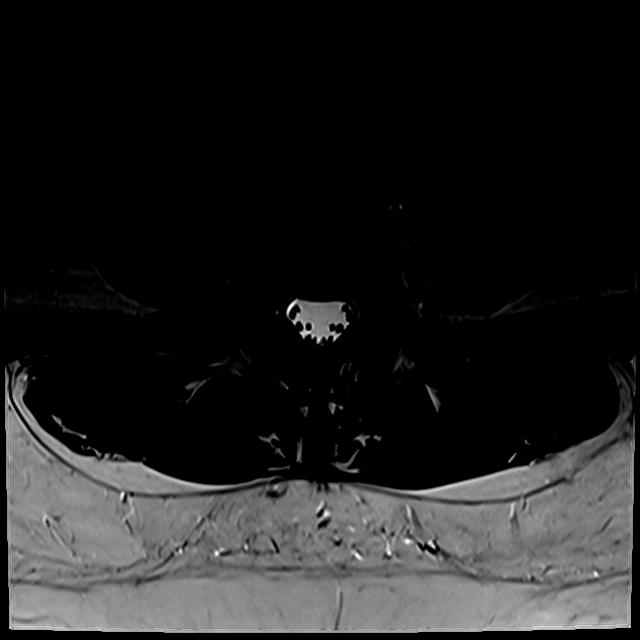
[im 20/42]
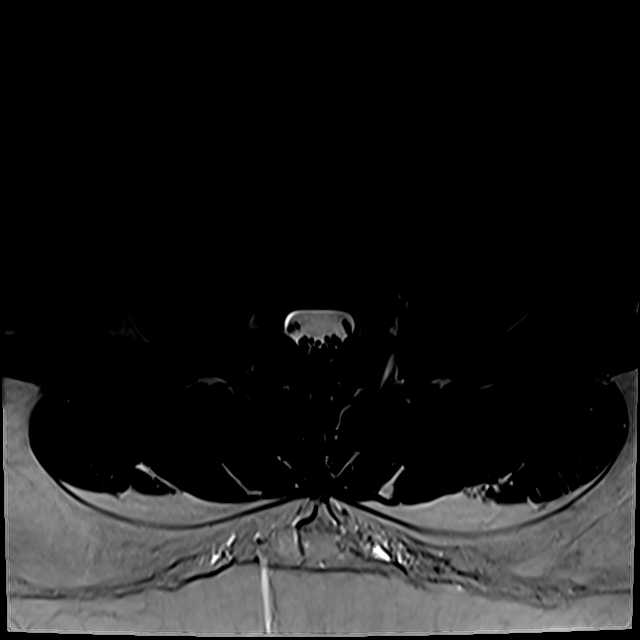
[im 22/42]
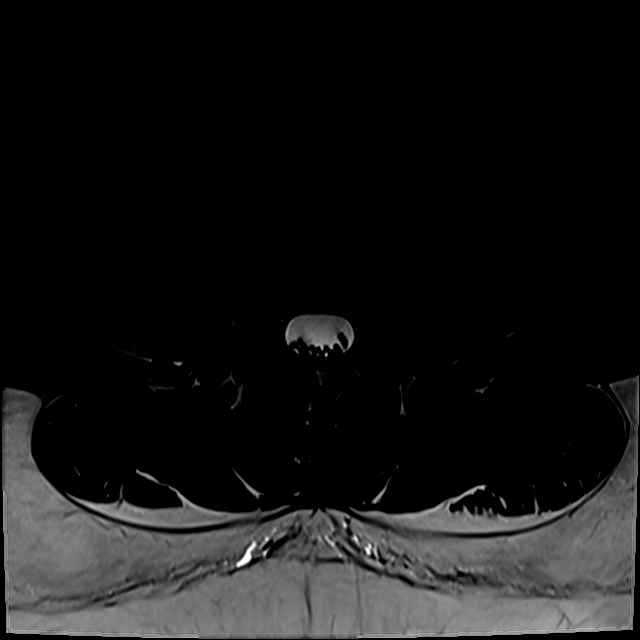
[im 36/42]
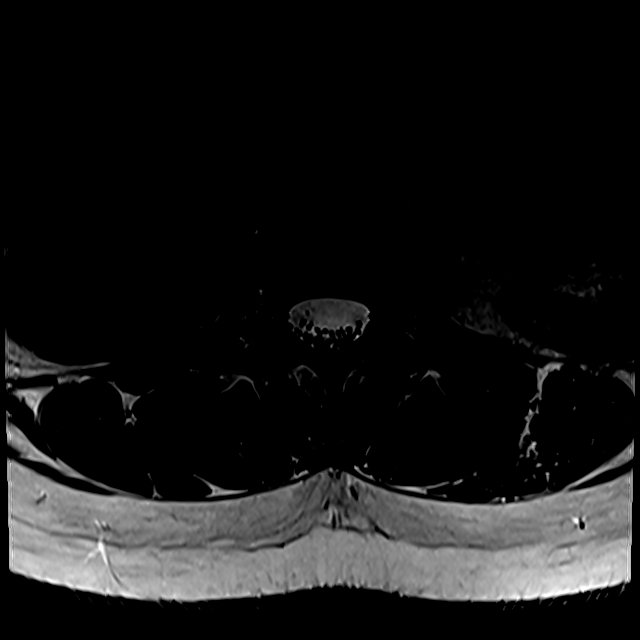

[Series 100: T1 · axial · 4.0mm · 0.28mm/px · z∈[-126,+43]mm · 3 of 42 slices shown (2 of 2)]
[im 6/42]
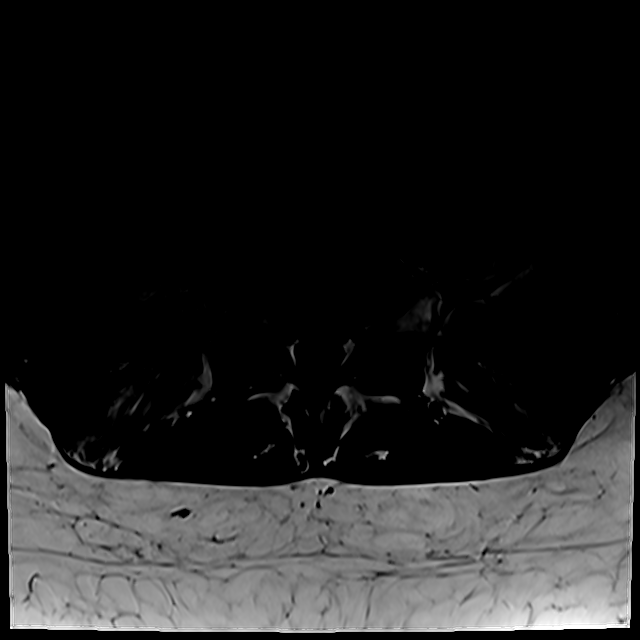
[im 22/42]
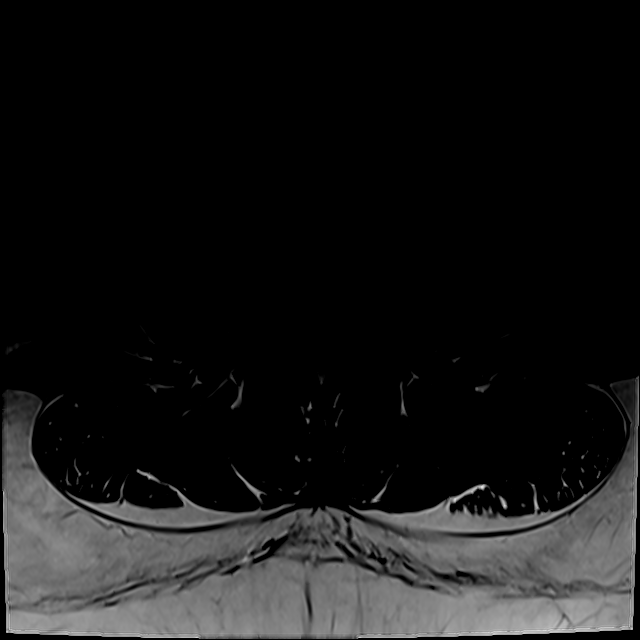
[im 36/42]
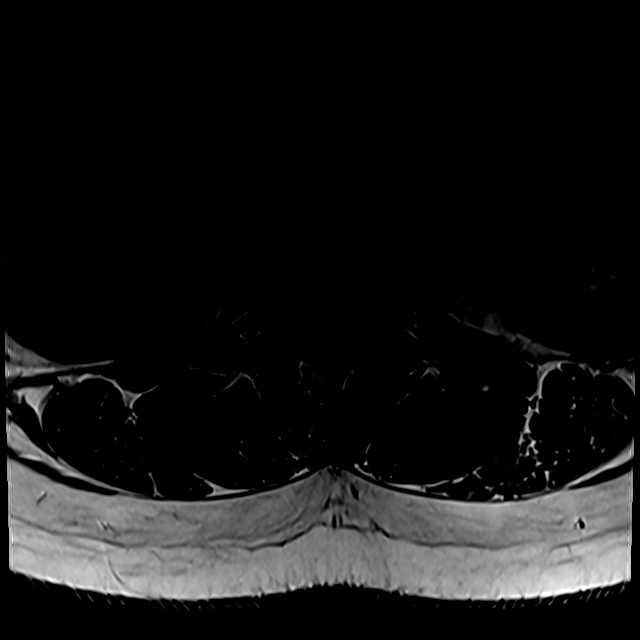

[18 of 48 positions shown; findings below may reference images not displayed]

FINDINGS: Segmentation:  Standard.

Alignment:  Preserved.

Vertebrae: Vertebral body heights are maintained. There is no
substantial marrow edema. No suspicious osseous lesion.

Conus medullaris and cauda equina: Conus extends to the L1 level.
Conus and cauda equina appear normal.

Paraspinal and other soft tissues: Unremarkable.

Disc levels:

L1-L2:  No stenosis.

L2-L3:  No stenosis.

L3-L4:  No stenosis.

L4-L5: Disc desiccation without height loss. Punctate central
annular fissure. No stenosis.

L5-S1: Disc desiccation and height loss. Central disc protrusion. No
canal stenosis. Disc is in proximity to traversing S1 and S2 nerve
roots and most directly abuts the right S2 nerve roots.
IMPRESSION: Disc protrusion at L5-S[DATE] be responsible for reported radicular
symptoms.

## 2021-07-15 DIAGNOSIS — M47816 Spondylosis without myelopathy or radiculopathy, lumbar region: Secondary | ICD-10-CM | POA: Insufficient documentation

## 2021-07-16 DIAGNOSIS — M5127 Other intervertebral disc displacement, lumbosacral region: Secondary | ICD-10-CM | POA: Insufficient documentation

## 2021-07-16 DIAGNOSIS — M533 Sacrococcygeal disorders, not elsewhere classified: Secondary | ICD-10-CM | POA: Insufficient documentation

## 2021-08-18 DIAGNOSIS — I81 Portal vein thrombosis: Secondary | ICD-10-CM

## 2021-08-18 HISTORY — DX: Portal vein thrombosis: I81

## 2021-09-08 ENCOUNTER — Encounter: Payer: Self-pay | Admitting: Emergency Medicine

## 2021-09-08 ENCOUNTER — Other Ambulatory Visit: Payer: Self-pay

## 2021-09-08 DIAGNOSIS — R112 Nausea with vomiting, unspecified: Secondary | ICD-10-CM | POA: Diagnosis not present

## 2021-09-08 DIAGNOSIS — F17210 Nicotine dependence, cigarettes, uncomplicated: Secondary | ICD-10-CM | POA: Diagnosis not present

## 2021-09-08 DIAGNOSIS — R197 Diarrhea, unspecified: Secondary | ICD-10-CM | POA: Insufficient documentation

## 2021-09-08 DIAGNOSIS — R1010 Upper abdominal pain, unspecified: Secondary | ICD-10-CM | POA: Insufficient documentation

## 2021-09-08 LAB — CBC
HCT: 37 % (ref 36.0–46.0)
Hemoglobin: 11.7 g/dL — ABNORMAL LOW (ref 12.0–15.0)
MCH: 27.4 pg (ref 26.0–34.0)
MCHC: 31.6 g/dL (ref 30.0–36.0)
MCV: 86.7 fL (ref 80.0–100.0)
Platelets: 352 10*3/uL (ref 150–400)
RBC: 4.27 MIL/uL (ref 3.87–5.11)
RDW: 14.2 % (ref 11.5–15.5)
WBC: 6.6 10*3/uL (ref 4.0–10.5)
nRBC: 0 % (ref 0.0–0.2)

## 2021-09-08 LAB — URINALYSIS, ROUTINE W REFLEX MICROSCOPIC
Bilirubin Urine: NEGATIVE
Glucose, UA: NEGATIVE mg/dL
Ketones, ur: NEGATIVE mg/dL
Leukocytes,Ua: NEGATIVE
Nitrite: NEGATIVE
Protein, ur: NEGATIVE mg/dL
Specific Gravity, Urine: 1.006 (ref 1.005–1.030)
pH: 7 (ref 5.0–8.0)

## 2021-09-08 LAB — COMPREHENSIVE METABOLIC PANEL
ALT: 28 U/L (ref 0–44)
AST: 29 U/L (ref 15–41)
Albumin: 4.2 g/dL (ref 3.5–5.0)
Alkaline Phosphatase: 69 U/L (ref 38–126)
Anion gap: 5 (ref 5–15)
BUN: 10 mg/dL (ref 6–20)
CO2: 28 mmol/L (ref 22–32)
Calcium: 9.4 mg/dL (ref 8.9–10.3)
Chloride: 103 mmol/L (ref 98–111)
Creatinine, Ser: 0.6 mg/dL (ref 0.44–1.00)
GFR, Estimated: >60 mL/min (ref >60)
Glucose, Bld: 99 mg/dL (ref 70–99)
Potassium: 3.8 mmol/L (ref 3.5–5.1)
Sodium: 136 mmol/L (ref 135–145)
Total Bilirubin: 0.4 mg/dL (ref 0.3–1.2)
Total Protein: 7.4 g/dL (ref 6.5–8.1)

## 2021-09-08 LAB — LIPASE, BLOOD: Lipase: 36 U/L (ref 11–51)

## 2021-09-08 LAB — POC URINE PREG, ED: Preg Test, Ur: NEGATIVE

## 2021-09-08 NOTE — ED Triage Notes (Signed)
C/O abdominal pain, vomiting and diarrhea since Friday.  States symptoms started Friday after eating chicken wings.

## 2021-09-09 ENCOUNTER — Emergency Department: Payer: BC Managed Care – PPO

## 2021-09-09 ENCOUNTER — Emergency Department
Admission: EM | Admit: 2021-09-09 | Discharge: 2021-09-09 | Disposition: A | Discharge status: Home / Self Care | Payer: BC Managed Care – PPO | Attending: Emergency Medicine | Admitting: Emergency Medicine

## 2021-09-09 DIAGNOSIS — R1010 Upper abdominal pain, unspecified: Secondary | ICD-10-CM

## 2021-09-09 DIAGNOSIS — R197 Diarrhea, unspecified: Secondary | ICD-10-CM

## 2021-09-09 DIAGNOSIS — R112 Nausea with vomiting, unspecified: Secondary | ICD-10-CM

## 2021-09-09 DIAGNOSIS — I81 Portal vein thrombosis: Secondary | ICD-10-CM

## 2021-09-09 LAB — PROTIME-INR
INR: 1 (ref 0.8–1.2)
Prothrombin Time: 13.6 seconds (ref 11.4–15.2)

## 2021-09-09 LAB — APTT: aPTT: 26 seconds (ref 24–36)

## 2021-09-09 IMAGING — US US ABDOMEN LIMITED
1 series · 14 of 25 positions shown · non-contrast
Comparison: None.

CLINICAL DATA: Upper abdominal pain for several days

EXAM:
ULTRASOUND ABDOMEN LIMITED RIGHT UPPER QUADRANT

[Series 1: us abdomen limited ruq (liver/gb) · 14 of 43 slices shown]
[im 1/43]
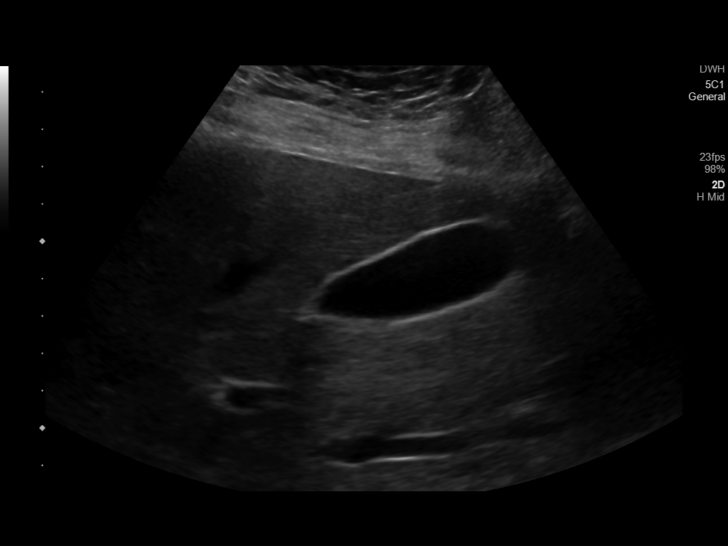
[im 4/43]
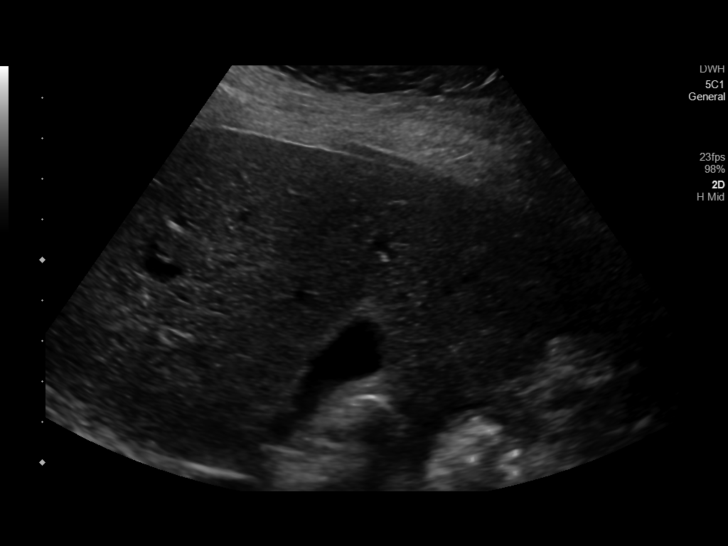
[im 8/43]
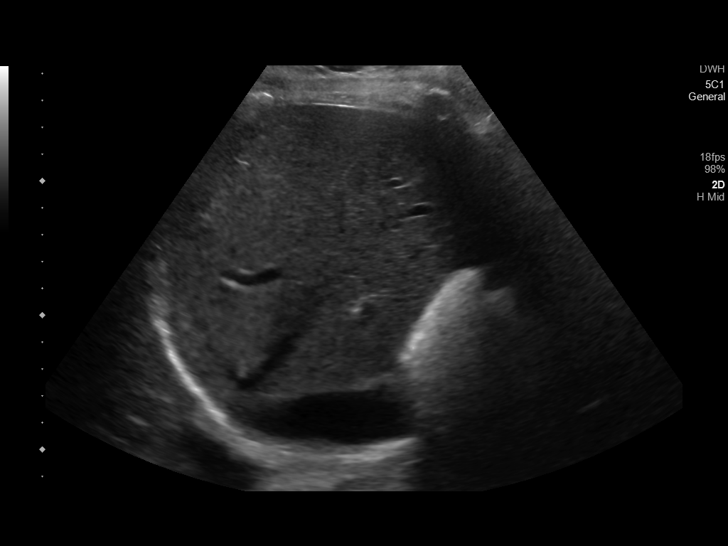
[im 11/43]
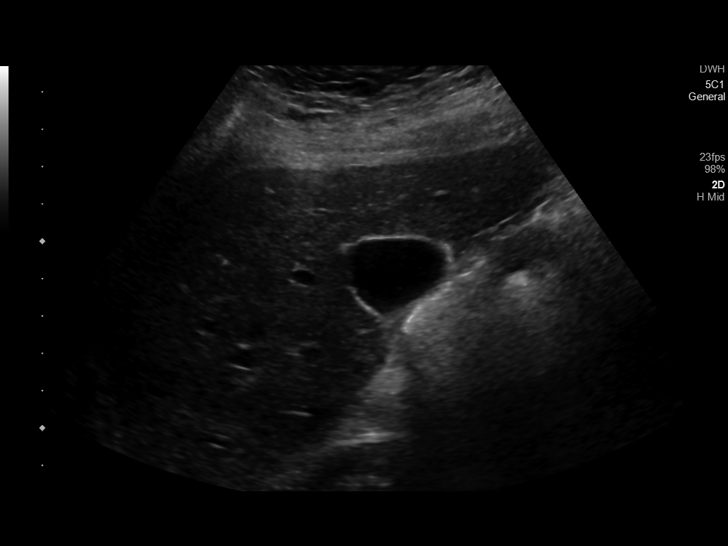
[im 15/43]
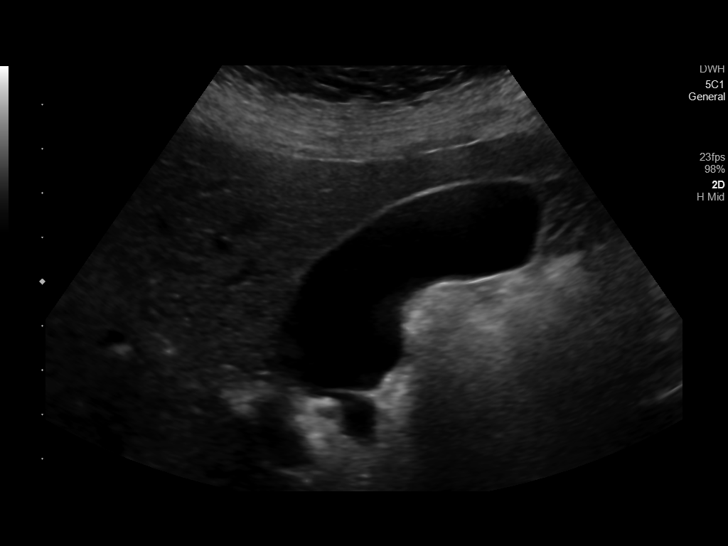
[im 16/43]
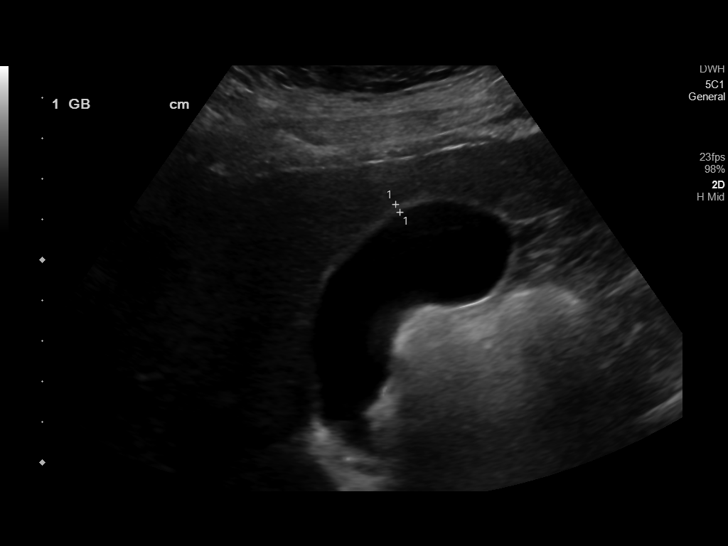
[im 20/43]
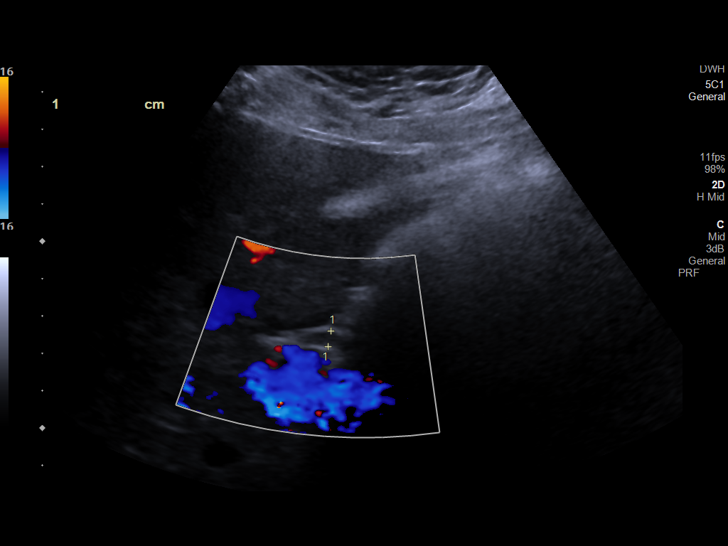
[im 23/43]
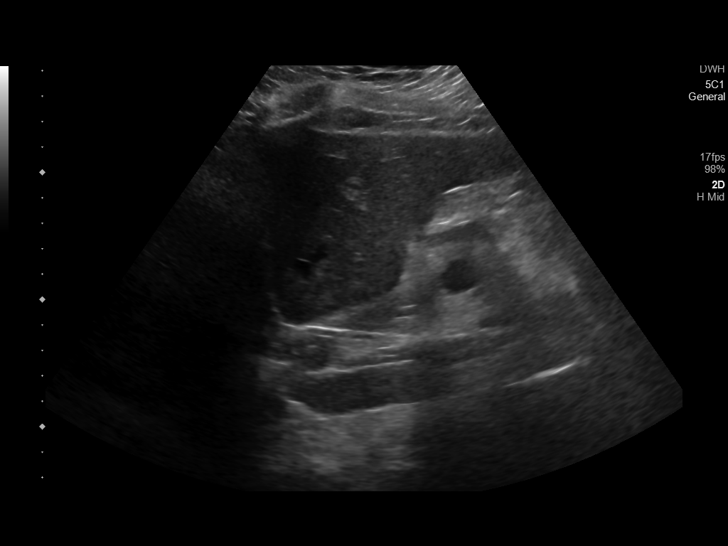
[im 27/43]
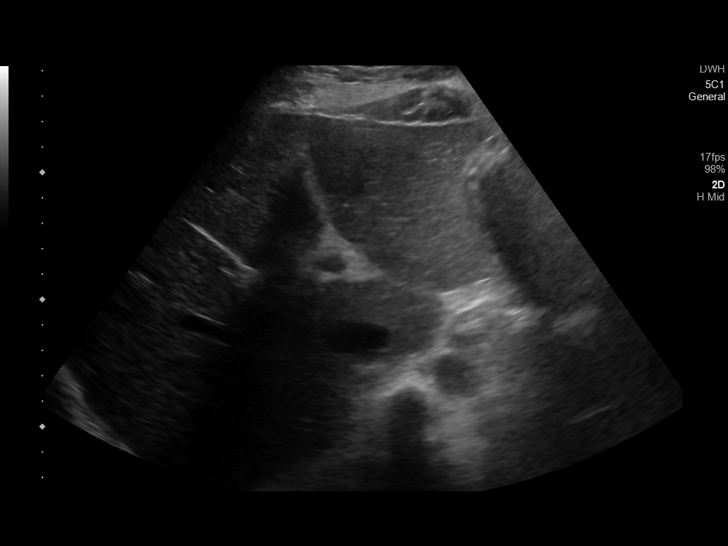
[im 29/43]
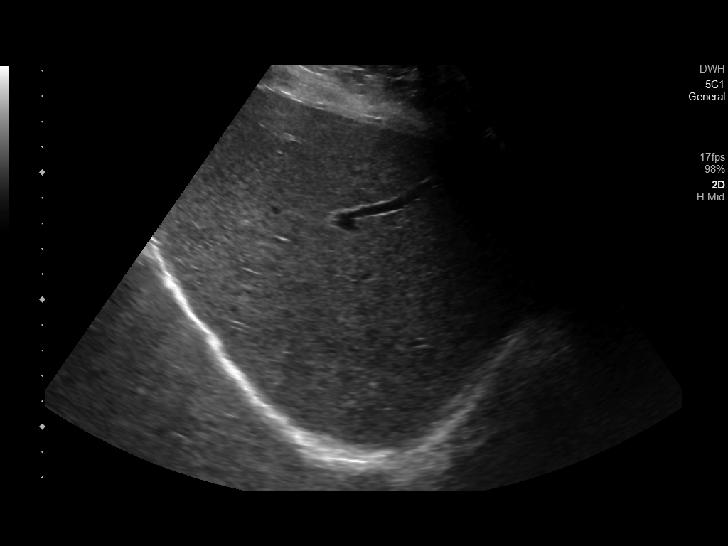
[im 32/43]
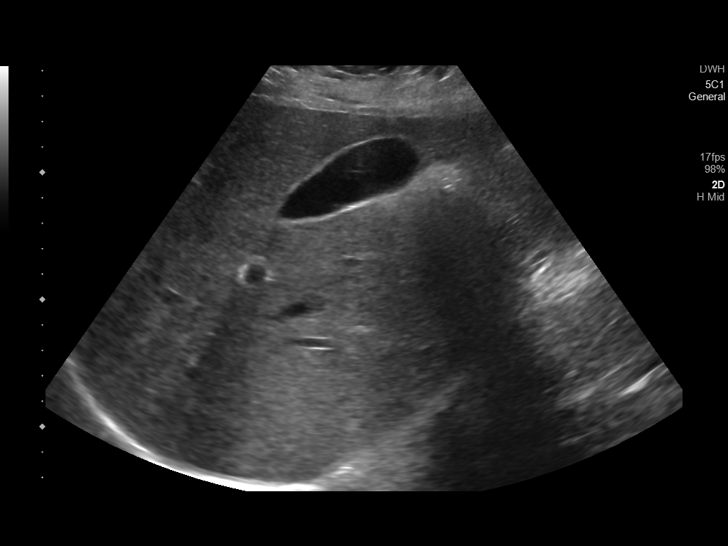
[im 36/43]
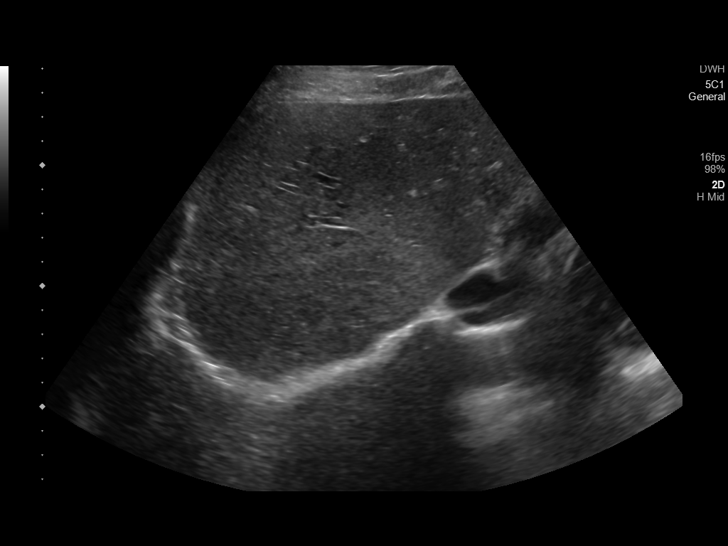
[im 39/43]
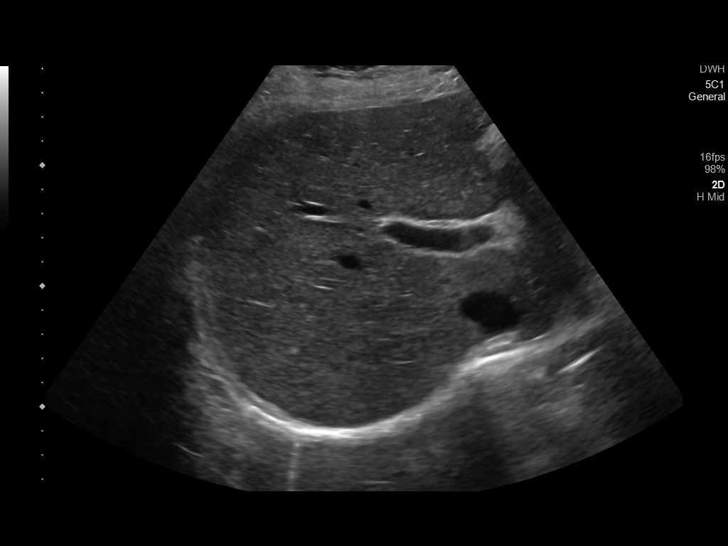
[im 43/43]
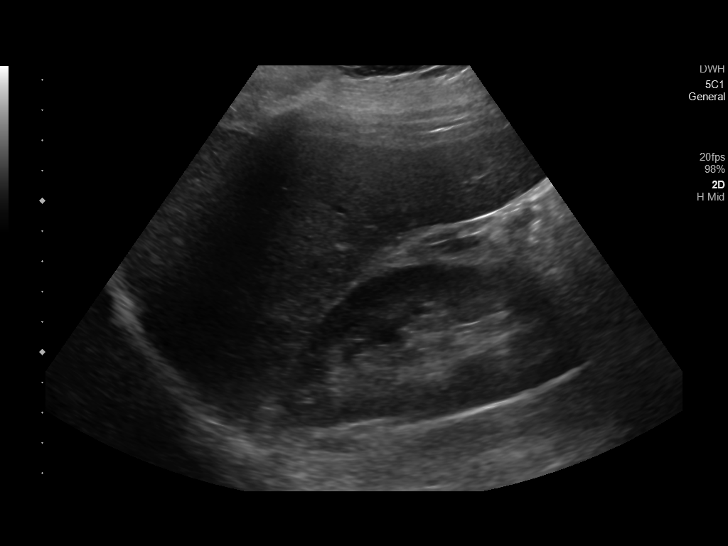

[14 of 25 positions shown; findings below may reference images not displayed]

FINDINGS: Gallbladder:

No gallstones or wall thickening visualized. No sonographic Murphy
sign noted by sonographer.

Common bile duct:

Diameter: 4.3 mm

Liver:

No focal lesion identified. Within normal limits in parenchymal
echogenicity. Portal vein is patent on color Doppler imaging with
normal direction of blood flow towards the liver.

Other: None.
IMPRESSION: No acute abnormality noted.

## 2021-09-09 IMAGING — CT CT ABD-PELV W/ CM
3 of 11 series · 12 of 46 positions shown, 18 images · IV contrast (omnipaque)
Comparison: CT the abdomen and pelvis [DATE].

CLINICAL DATA: 40-year-old female with history of epigastric pain
for the past 5 days after eating chicken wings.

EXAM:
CT ABDOMEN AND PELVIS WITH CONTRAST
TECHNIQUE: Multidetector CT imaging of the abdomen and pelvis was performed
using the standard protocol following bolus administration of
intravenous contrast.
CONTRAST:  100mL OMNIPAQUE IOHEXOL 300 MG/ML  SOLN

[Series 3: coronal arterial · coronal · arterial · 0.96mm/px · 2 of 102 slices shown, 3 images]
[im 34/102  soft-tissue]
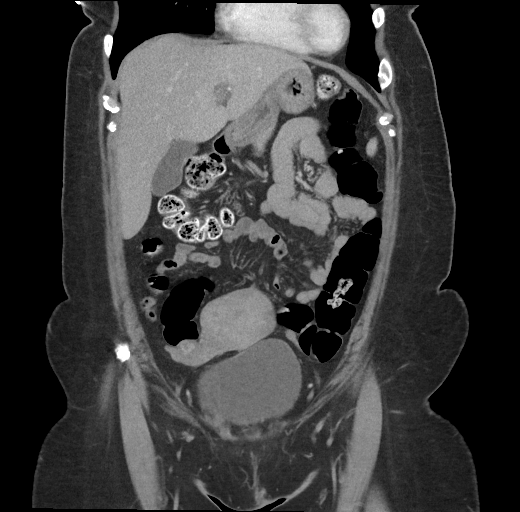
[im 34/102  bone]
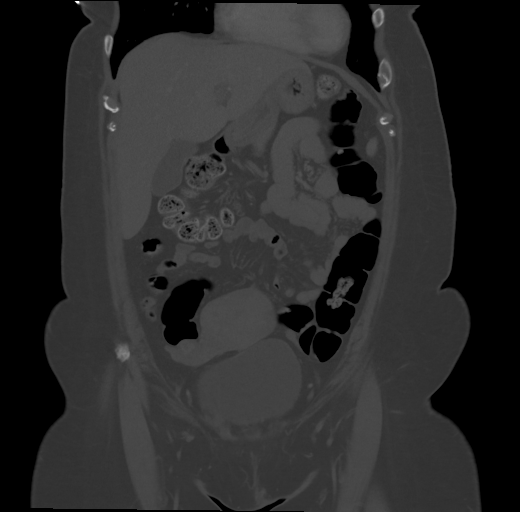
[im 68/102  soft-tissue]
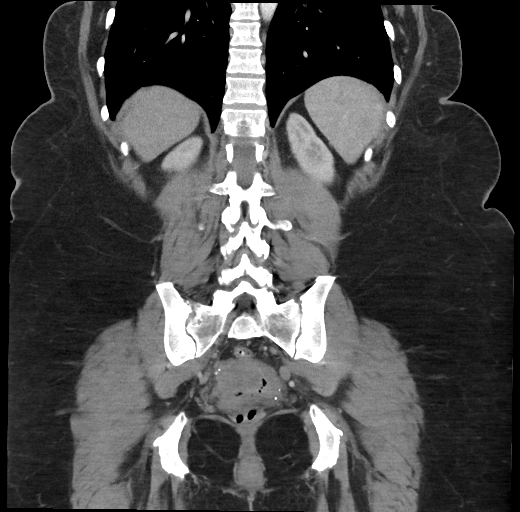

[Series 7: axial venous · axial · portal-venous · 0.98mm/px · z∈[-484,-121]mm · 7 of 163 slices shown, 12 images]
[im 21/163  soft-tissue]
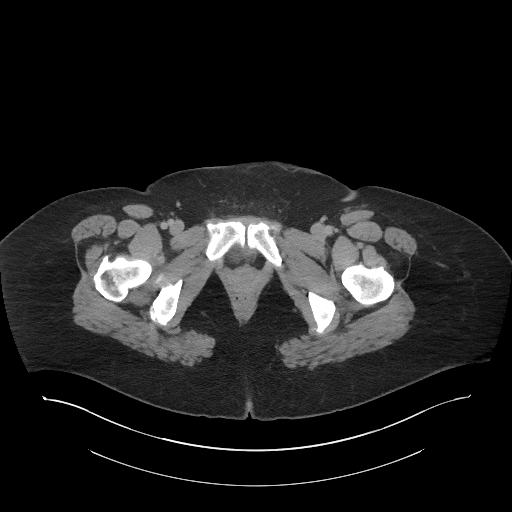
[im 21/163  bone]
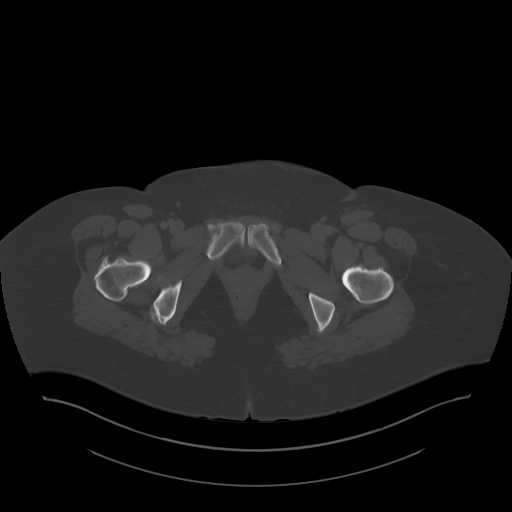
[im 41/163  soft-tissue]
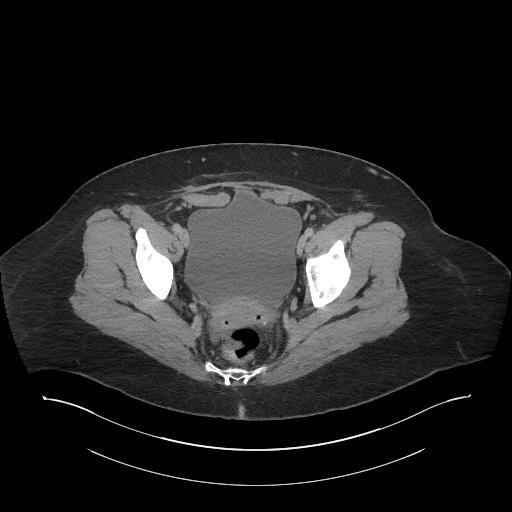
[im 61/163  soft-tissue]
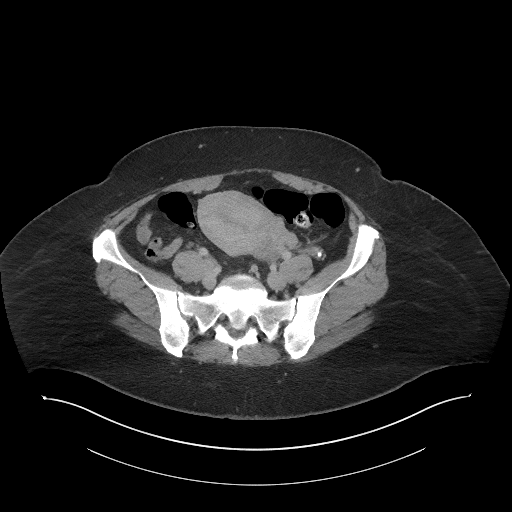
[im 82/163  soft-tissue]
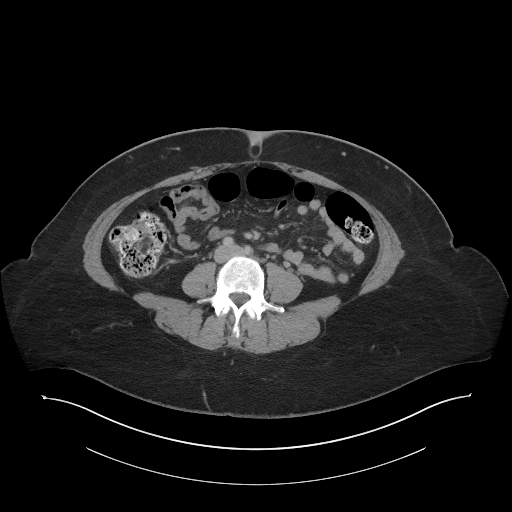
[im 82/163  lung]
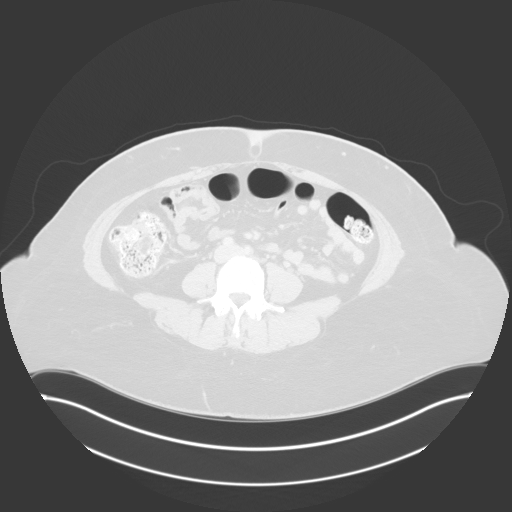
[im 102/163  soft-tissue]
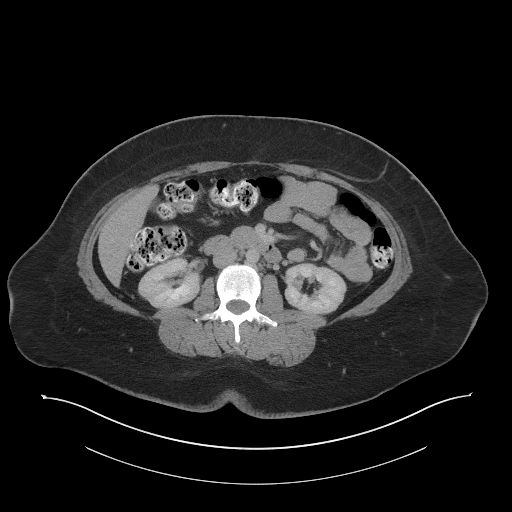
[im 102/163  lung]
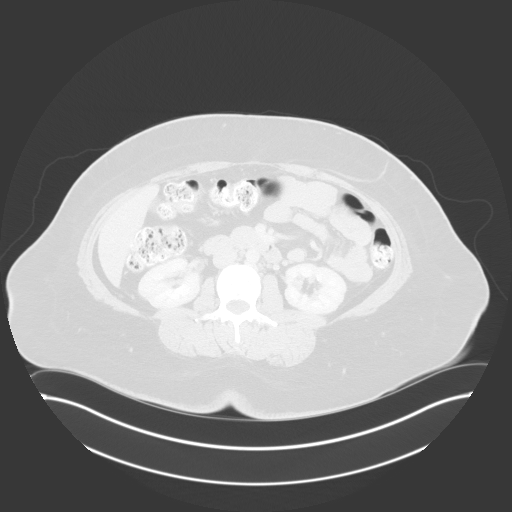
[im 122/163  soft-tissue]
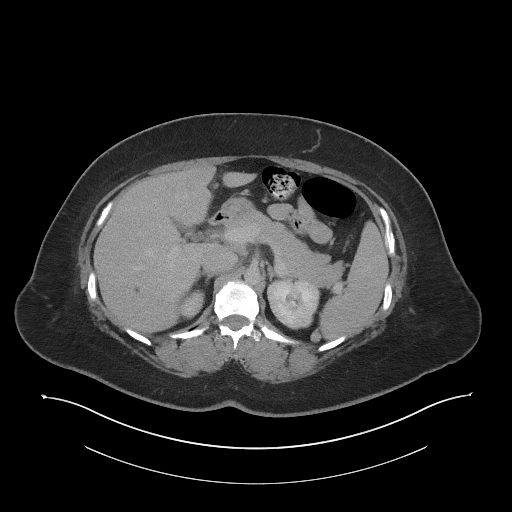
[im 122/163  lung]
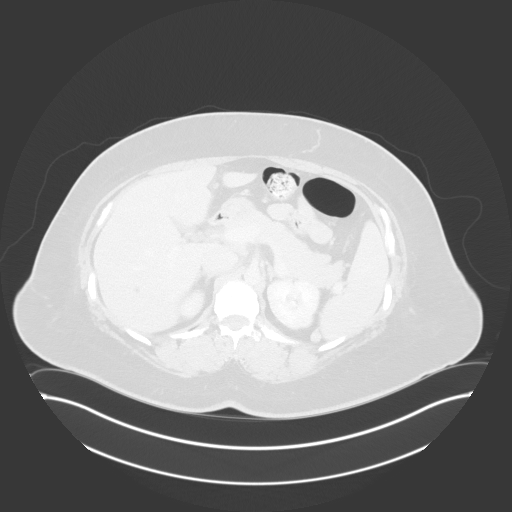
[im 142/163  soft-tissue]
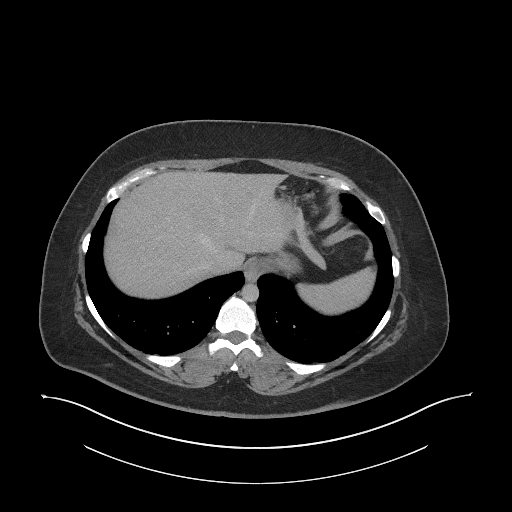
[im 142/163  lung]
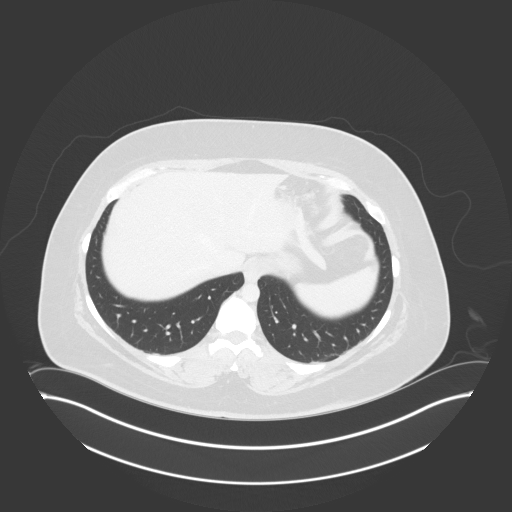

[Series 12: delay · axial · delayed · 0.98mm/px · z∈[-484,-364]mm · 3 of 163 slices shown]
[im 21/163  soft-tissue]
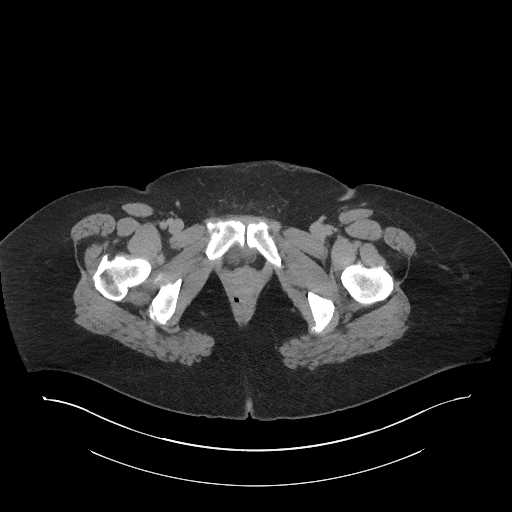
[im 41/163  soft-tissue]
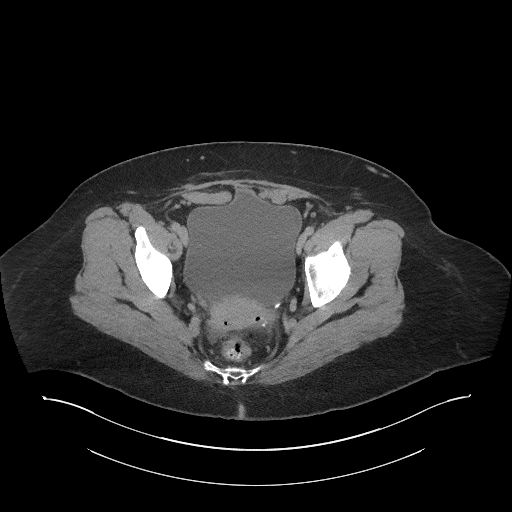
[im 61/163  soft-tissue]
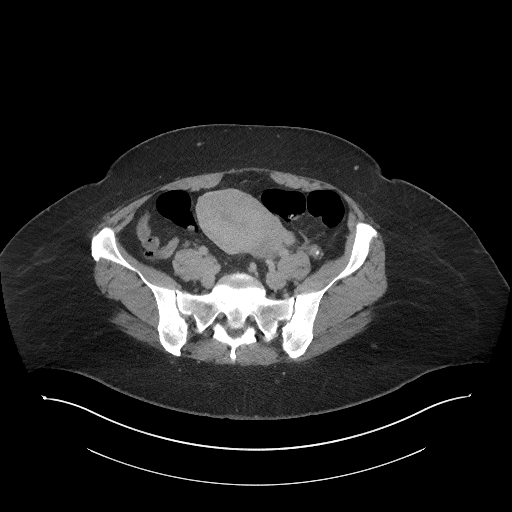

[12 of 46 positions shown; findings below may reference images not displayed]

FINDINGS: Lower chest: Unremarkable.

Hepatobiliary: There is occlusion of the left portal vein and nearly
all of the downstream portal venous branches in the left lobe of the
liver. Subcentimeter low-attenuation lesion in the right lobe of the
liver (axial image 44 of series 2), too small to characterize, but
statistically likely to represent a small cyst. No other suspicious
cystic or solid hepatic lesions. No intra or extrahepatic biliary
ductal dilatation. Gallbladder is normal in appearance.

Pancreas: No pancreatic mass. No pancreatic ductal dilatation. No
pancreatic or peripancreatic fluid collections or inflammatory
changes.

Spleen: Unremarkable.

Adrenals/Urinary Tract: Bilateral kidneys and adrenal glands are
normal in appearance. No hydroureteronephrosis. Urinary bladder is
normal in appearance.

Stomach/Bowel: The appearance of the stomach is normal. No
pathologic dilatation of small bowel or colon. The appendix is not
confidently identified and may be surgically absent. Regardless,
there are no inflammatory changes noted adjacent to the cecum to
suggest the presence of an acute appendicitis at this time.

Vascular/Lymphatic: No significant atherosclerotic disease, aneurysm
or dissection noted in the abdominal or pelvic vasculature. No
lymphadenopathy noted in the abdomen or pelvis.

Reproductive: Uterus and ovaries are unremarkable in appearance.

Other: No significant volume of ascites.  No pneumoperitoneum.

Musculoskeletal: There are no aggressive appearing lytic or blastic
lesions noted in the visualized portions of the skeleton.
IMPRESSION: 1. Complete occlusion of the left portal vein and downstream portal
venous branches.
2. No other acute findings are noted elsewhere in the abdomen or
pelvis to account for the patient's symptoms.

## 2021-09-09 MED ORDER — OXYCODONE-ACETAMINOPHEN 5-325 MG PO TABS: 1.0000 | ORAL_TABLET | Freq: Three times a day (TID) | ORAL | 0 refills | Status: AC | PRN

## 2021-09-09 MED ORDER — ENOXAPARIN SODIUM 40 MG/0.4ML IJ SOSY: 1.0000 mg/kg | PREFILLED_SYRINGE | Freq: Two times a day (BID) | INTRAMUSCULAR | 0 refills | Status: DC

## 2021-09-09 MED ORDER — ONDANSETRON HCL 4 MG/2ML IJ SOLN
4.0000 mg | Freq: Once | INTRAMUSCULAR | Status: AC
  Administered 2021-09-09: 4 mg via INTRAVENOUS
  Filled 2021-09-09: qty 2

## 2021-09-09 MED ORDER — LACTATED RINGERS IV BOLUS
1000.0000 mL | Freq: Once | INTRAVENOUS | Status: AC
  Administered 2021-09-09: 1000 mL via INTRAVENOUS

## 2021-09-09 MED ORDER — IOHEXOL 300 MG/ML  SOLN
100.0000 mL | Freq: Once | INTRAMUSCULAR | Status: AC | PRN
  Administered 2021-09-09: 100 mL via INTRAVENOUS

## 2021-09-09 MED ORDER — ACETAMINOPHEN 500 MG PO TABS
1000.0000 mg | ORAL_TABLET | Freq: Once | ORAL | Status: AC
  Administered 2021-09-09: 1000 mg via ORAL
  Filled 2021-09-09: qty 2

## 2021-09-09 MED ORDER — ONDANSETRON HCL 4 MG PO TABS: 4.0000 mg | ORAL_TABLET | Freq: Three times a day (TID) | ORAL | 0 refills | Status: DC | PRN

## 2021-09-09 MED ORDER — FAMOTIDINE IN NACL 20-0.9 MG/50ML-% IV SOLN
20.0000 mg | Freq: Once | INTRAVENOUS | Status: AC
Start: 2021-09-09 — End: 2021-09-09
  Administered 2021-09-09: 20 mg via INTRAVENOUS
  Filled 2021-09-09: qty 50

## 2021-09-09 MED ORDER — FENTANYL CITRATE PF 50 MCG/ML IJ SOSY
50.0000 ug | PREFILLED_SYRINGE | Freq: Once | INTRAMUSCULAR | Status: AC
  Administered 2021-09-09: 50 ug via INTRAVENOUS
  Filled 2021-09-09: qty 1

## 2021-09-09 MED ORDER — SODIUM CHLORIDE 0.9 % IV BOLUS
1000.0000 mL | Freq: Once | INTRAVENOUS | Status: AC
  Administered 2021-09-09: 1000 mL via INTRAVENOUS

## 2021-09-09 MED ORDER — ENOXAPARIN SODIUM 100 MG/ML IJ SOSY
1.0000 mg/kg | PREFILLED_SYRINGE | Freq: Once | INTRAMUSCULAR | Status: AC
  Administered 2021-09-09: 90 mg via SUBCUTANEOUS
  Filled 2021-09-09: qty 0.9

## 2021-09-09 NOTE — ED Notes (Signed)
Pt in bed, pt states that she is feeling better, denies dizziness, states that she is ready to go home, d/c pt iv, cath intact, pt verbalized understanding d/c and follow up, ambulatory from dpt with steady gait.

## 2021-09-09 NOTE — ED Notes (Signed)
Pt in bed, pt reports 2/10 pain , states that she doesn't need anything for pain at this time.

## 2021-09-09 NOTE — ED Notes (Signed)
Pt to CT

## 2021-09-09 NOTE — ED Provider Notes (Signed)
I assumed care of this patient approximately 0 700.  Please see outgoing providers note for full details regarding patient's initial evaluation assessment.  In brief patient presents for assessment of 3 or 4 days of some crampy abdominal pain associate with some vomiting diarrhea that developed yesterday.  Initial work-up largely unremarkable although CT abdomen pelvis without contrast cannot rule out portal vein thrombosis.  Plan is to follow-up CT on pelvis with contrast.  If this is unremarkable most likely gastroenteritis    On my reassessment patient is pending CT and stating she is feeling much better.   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  No other clear acute process.  I discussed with on-call hematologist Dr. Loa Idler Robert who recommended starting patient on Lovenox 1 meg per cake twice daily and outpatient follow-up.  Discussed this with patient.  Started on Lovenox in the emergency room.  She states she is feeling much better on my assessment and is able to tolerate p.o. stand and walk without difficulty.  Rx written for analgesia antiemetics and Lovenox.  Discharged in stable condition.    Gilles Chiquito, MD 09/09/21 1005

## 2021-09-09 NOTE — ED Notes (Signed)
Juice and crackers given.

## 2021-09-09 NOTE — ED Provider Notes (Signed)
[facility] Emergency Department Provider Note   ____________________________________________   Event Date/Time   First MD Initiated Contact with Patient 09/09/21 0023     (approximate)  I have reviewed the triage vital signs and the nursing notes.   HISTORY  Chief Complaint Abdominal Pain    HPI Amy Maxwell is a 40 y.o. female who presents to the ED from home with a chief complaint of abdominal pain.  Patient reports upper abdominal pain x5 days after eating chicken wings.  Endorses burning type pain.  Tried Pepto-Bismol without relief of symptoms.  Today began to have 2 episodes of vomiting and diarrhea so called her doctor who referred her to the ED for further evaluation.  Denies fever, chills, cough, chest pain, shortness of breath, dysuria.  Denies recent travel, trauma or antibiotic use.     Past Medical History:  Diagnosis Date   Tobacco use disorder     Patient Active Problem List   Diagnosis Date Noted   Infected cat bite of index finger 02/15/2018   Tobacco use disorder 02/15/2018    Past Surgical History:  Procedure Laterality Date   CARPAL TUNNEL RELEASE Right    CESAREAN SECTION     VENTRAL HERNIA REPAIR    Appendectomy  Prior to Admission medications   Medication Sig Start Date End Date Taking? Authorizing Provider  oxyCODONE-acetaminophen (PERCOCET/ROXICET) 5-325 MG tablet Take 1 tablet by mouth every 4 (four) hours as needed for severe pain. 02/15/18   Altamese Dilling, MD    Allergies Patient has no known allergies.  Family History  Problem Relation Age of Onset   Cirrhosis Father    Alcoholism Father     Social History Social History   Tobacco Use   Smoking status: Every Day    Packs/day: 0.50    Years: 2.00    Pack years: 1.00    Types: Cigarettes   Smokeless tobacco: Never  Vaping Use   Vaping Use: Never used  Substance Use Topics   Alcohol use: Not Currently   Drug use: Never    Review of  Systems  Constitutional: No fever/chills Eyes: No visual changes. ENT: No sore throat. Cardiovascular: Denies chest pain. Respiratory: Denies shortness of breath. Gastrointestinal: Positive for upper abdominal pain, nausea, vomiting and diarrhea.  No constipation. Genitourinary: Negative for dysuria. Musculoskeletal: Negative for back pain. Skin: Negative for rash. Neurological: Negative for headaches, focal weakness or numbness.   ____________________________________________   PHYSICAL EXAM:  VITAL SIGNS: ED Triage Vitals  Enc Vitals Group     BP 09/08/21 1757 126/80     Pulse Rate 09/08/21 1757 85     Resp 09/08/21 1757 16     Temp 09/08/21 1757 98.8 F (37.1 C)     Temp Source 09/08/21 1757 Oral     SpO2 09/08/21 1757 100 %     Weight 09/08/21 1724 200 lb 6.4 oz (90.9 kg)     Height 09/08/21 1724 5\' 4"  (1.626 m)     Head Circumference --      Peak Flow --      Pain Score 09/08/21 1724 0     Pain Loc --      Pain Edu? --      Excl. in GC? --     Constitutional: Alert and oriented. Well appearing and in mild acute distress. Eyes: Conjunctivae are normal. PERRL. EOMI. Head: Atraumatic. Nose: No congestion/rhinnorhea. Mouth/Throat: Mucous membranes are moist.   Neck: No stridor.   Cardiovascular:  Normal rate, regular rhythm. Grossly normal heart sounds.  Good peripheral circulation. Respiratory: Normal respiratory effort.  No retractions. Lungs CTAB. Gastrointestinal: Soft and mildly tender to palpation epigastrium and right upper quadrant without rebound or guarding. No distention. No abdominal bruits. No CVA tenderness. Musculoskeletal: No lower extremity tenderness nor edema.  No joint effusions. Neurologic:  Normal speech and language. No gross focal neurologic deficits are appreciated. No gait instability. Skin:  Skin is warm, dry and intact. No rash noted. Psychiatric: Mood and affect are normal. Speech and behavior are  normal.  ____________________________________________   LABS (all labs ordered are listed, but only abnormal results are displayed)  Labs Reviewed  CBC - Abnormal; Notable for the following components:      Result Value   Hemoglobin 11.7 (*)    All other components within normal limits  URINALYSIS, ROUTINE W REFLEX MICROSCOPIC - Abnormal; Notable for the following components:   Color, Urine STRAW (*)    APPearance CLEAR (*)    Hgb urine dipstick SMALL (*)    Bacteria, UA MANY (*)    All other components within normal limits  LIPASE, BLOOD  COMPREHENSIVE METABOLIC PANEL  POC URINE PREG, ED   ____________________________________________  EKG  None ____________________________________________  RADIOLOGY I, Caralynn Gelber J, personally viewed and evaluated these images (plain radiographs) as part of my medical decision making, as well as reviewing the written report by the radiologist.  ED MD interpretation: Unremarkable ultrasound; CTA discussed with Dr. Grace Isaac concerning for portal vein thrombosis, will send back to CT for dedicated liver protocol with IV contrast  Official radiology report(s): CT Abdomen Pelvis Wo Contrast  Result Date: 09/09/2021 CLINICAL DATA:  Nausea and vomiting with abdominal pain since Friday EXAM: CT ABDOMEN AND PELVIS WITHOUT CONTRAST TECHNIQUE: Multidetector CT imaging of the abdomen and pelvis was performed following the standard protocol without IV contrast. COMPARISON:  Right upper quadrant ultrasound from earlier today FINDINGS: Lower chest:  No contributory findings. Hepatobiliary: High-density left main portal vein. No hepatic parenchymal finding.No evidence of biliary obstruction or stone. Pancreas: Unremarkable. Spleen: Unremarkable. Adrenals/Urinary Tract: Negative adrenals. No hydronephrosis or stone. Mild bulging of the bladder at a remote abdominal wall incision, without true hernia. Stomach/Bowel: No obstruction. Appendectomy. No visible bowel  inflammation Vascular/Lymphatic: No acute vascular abnormality. No mass or adenopathy. Reproductive:Symmetric ovarian size. Other: No ascites or pneumoperitoneum. Musculoskeletal: No acute abnormalities. These results were called by telephone at the time of interpretation on 09/09/2021 at 4:17 am to provider Faith Branan , who verbally acknowledged these results. IMPRESSION: High-density left portal vein suggesting acute thrombosis, recommend liver protocol enhanced CT of the abdomen and pelvis (can omit noncontrast phase given the current study). Electronically Signed   By: Tiburcio Pea M.D.   On: 09/09/2021 04:19   US ABDOMEN LIMITED RUQ (LIVER/GB)  Result Date: 09/09/2021 CLINICAL DATA:  Upper abdominal pain for several days EXAM: ULTRASOUND ABDOMEN LIMITED RIGHT UPPER QUADRANT COMPARISON:  None. FINDINGS: Gallbladder: No gallstones or wall thickening visualized. No sonographic Murphy sign noted by sonographer. Common bile duct: Diameter: 4.3 mm Liver: No focal lesion identified. Within normal limits in parenchymal echogenicity. Portal vein is patent on color Doppler imaging with normal direction of blood flow towards the liver. Other: None. IMPRESSION: No acute abnormality noted. Electronically Signed   By: Alcide Clever M.D.   On: 09/09/2021 01:51    ____________________________________________   PROCEDURES  Procedure(s) performed (including Critical Care):  Procedures   ____________________________________________   INITIAL IMPRESSION / ASSESSMENT AND PLAN /  ED COURSE  As part of my medical decision making, I reviewed the following data within the electronic MEDICAL RECORD NUMBER Nursing notes reviewed and incorporated, Labs reviewed, Old chart reviewed, Radiograph reviewed, and Notes from prior ED visits     40 year old female presenting with upper abdominal pain x5 days and now N/V/D. Differential diagnosis includes, but is not limited to, biliary disease (biliary colic, acute  cholecystitis, cholangitis, choledocholithiasis, etc), intrathoracic causes for epigastric abdominal pain including ACS, gastritis, duodenitis, pancreatitis, small bowel or large bowel obstruction, abdominal aortic aneurysm, hernia, and ulcer(s).   Laboratory results unremarkable.  Will initiate IV hydration, Fentanyl for pain, Pepcid for indigestion, Zofran for nausea.  Proceed with right upper quadrant abdominal ultrasound to evaluate for cholecystitis.  Will reassess.  Clinical Course as of 09/09/21 0649  Wed Sep 09, 2021  4481 Ultrasound unremarkable.  Given patient's level of pain and protracted symptoms, will obtain CT abdomen/pelvis. [JS]  [id number] Discussed CT results with radiologist Dr. Grace Isaac; concern for portal vein thrombosis.  Queried patient and she denies use of OCPs for over 20 years.  Will return to CT scan for dedicated liver protocol CT with IV contrast. [JS]  0547 Patient awaiting CT scan.  Discomfort returning; will redose medicines. [JS]  0647 Pain improved after repeat IV Fentanyl.  Awaiting CT with IV contrast liver protocol.  Care will be transferred to the oncoming provider pending CT results. [JS]    Clinical Course User Index [JS] Irean Hong, MD     ____________________________________________   FINAL CLINICAL IMPRESSION(S) / ED DIAGNOSES  Final diagnoses:  Pain of upper abdomen  Nausea vomiting and diarrhea     ED Discharge Orders     None        Note:  This document was prepared using Dragon voice recognition software and may include unintentional dictation errors.    Irean Hong, MD 09/09/21 [id number]

## 2021-09-09 NOTE — ED Notes (Signed)
US bedside

## 2021-09-14 ENCOUNTER — Inpatient Hospital Stay: Payer: BC Managed Care – PPO | Attending: Oncology | Admitting: Oncology

## 2021-09-14 ENCOUNTER — Other Ambulatory Visit: Payer: Self-pay

## 2021-09-14 ENCOUNTER — Inpatient Hospital Stay: Payer: BC Managed Care – PPO

## 2021-09-14 ENCOUNTER — Encounter: Payer: Self-pay | Admitting: Oncology

## 2021-09-14 VITALS — BP 119/75 | HR 80 | Temp 98.9°F | Resp 18 | Wt 236.2 lb

## 2021-09-14 DIAGNOSIS — I81 Portal vein thrombosis: Secondary | ICD-10-CM

## 2021-09-14 DIAGNOSIS — Z811 Family history of alcohol abuse and dependence: Secondary | ICD-10-CM | POA: Diagnosis not present

## 2021-09-14 DIAGNOSIS — R1010 Upper abdominal pain, unspecified: Secondary | ICD-10-CM | POA: Insufficient documentation

## 2021-09-14 DIAGNOSIS — F17210 Nicotine dependence, cigarettes, uncomplicated: Secondary | ICD-10-CM | POA: Diagnosis not present

## 2021-09-14 DIAGNOSIS — M5417 Radiculopathy, lumbosacral region: Secondary | ICD-10-CM | POA: Diagnosis not present

## 2021-09-14 DIAGNOSIS — M545 Low back pain, unspecified: Secondary | ICD-10-CM | POA: Diagnosis not present

## 2021-09-14 DIAGNOSIS — R112 Nausea with vomiting, unspecified: Secondary | ICD-10-CM | POA: Diagnosis not present

## 2021-09-14 DIAGNOSIS — Z79899 Other long term (current) drug therapy: Secondary | ICD-10-CM | POA: Insufficient documentation

## 2021-09-14 DIAGNOSIS — R109 Unspecified abdominal pain: Secondary | ICD-10-CM | POA: Diagnosis not present

## 2021-09-14 DIAGNOSIS — Z8379 Family history of other diseases of the digestive system: Secondary | ICD-10-CM | POA: Insufficient documentation

## 2021-09-14 LAB — ANTITHROMBIN III: AntiThromb III Func: 111 % (ref 75–120)

## 2021-09-14 NOTE — Progress Notes (Signed)
Hematology/Oncology Consult note [facility] Telephone:([phone] [phone]  Patient Care Team: Ellene Route as PCP - General   Name of the patient: Amy Maxwell  VG:[id number]  Nov 03, 1980    Reason for referral-portal venous thrombosis   Referring physician-Dr. Hulan Saas  Date of visit: 09/14/21   History of presenting illness-patient is a 40 year old female with past medical history significant for L5 and S1 radiculopathy but otherwise no other significant problems who presented to the ER on 09/09/2021 with symptoms of right upper quadrant/epigastric pain pain.  She had an right upper quadrant ultrasound which wasUnremarkable.  This was followed by a CT abdomen pelvis with contrast which showed a complete occlusion of the left portal vein and downstream portal venous branches.  Subcentimeter low-attenuation lesion in the right lobe of the liver too small to characterize likely representing a cyst.  No intra or extrahepatic ductal dilatation and no pancreatic abnormality.  Patient was discharged on Lovenox.  Patient has not had any prior history of thrombosis.  Remote history of birth control when she was 35 but none recently.  Her father died of cirrhosis likely secondary to alcohol use although patient denies any significant alcohol intake or illicit drug use.  She denies any significant skin rash or joint swelling.  She did see neurosurgery at [facility] clinic for her low back pain and was diagnosed with L5-S1 radiculopathy.  The cause of this is unclear.  Patient is otherwise tolerating her Lovenox shots well.  She rates her pain as 3 out of 10 and occasional discomfort especially when she bends over.  ECOG PS- 0  Pain scale- 0   Review of systems- Review of Systems  Constitutional:  Negative for chills, fever, malaise/fatigue and weight loss.  HENT:  Negative for congestion, ear discharge and nosebleeds.   Eyes:  Negative for blurred  vision.  Respiratory:  Negative for cough, hemoptysis, sputum production, shortness of breath and wheezing.   Cardiovascular:  Negative for chest pain, palpitations, orthopnea and claudication.  Gastrointestinal:  Positive for abdominal pain. Negative for blood in stool, constipation, diarrhea, heartburn, melena, nausea and vomiting.  Genitourinary:  Negative for dysuria, flank pain, frequency, hematuria and urgency.  Musculoskeletal:  Negative for back pain, joint pain and myalgias.  Skin:  Negative for rash.  Neurological:  Negative for dizziness, tingling, focal weakness, seizures, weakness and headaches.  Endo/Heme/Allergies:  Does not bruise/bleed easily.  Psychiatric/Behavioral:  Negative for depression and suicidal ideas. The patient does not have insomnia.    Not on File  Patient Active Problem List   Diagnosis Date Noted   Infected cat bite of index finger 02/15/2018   Tobacco use disorder 02/15/2018     Past Medical History:  Diagnosis Date   Tobacco use disorder      Past Surgical History:  Procedure Laterality Date   CARPAL TUNNEL RELEASE Right    CESAREAN SECTION     VENTRAL HERNIA REPAIR      Social History   Socioeconomic History   Marital status: Married    Spouse name: Not on file   Number of children: Not on file   Years of education: Not on file   Highest education level: Not on file  Occupational History   Not on file  Tobacco Use   Smoking status: Every Day    Packs/day: 0.50    Years: 2.00    Pack years: 1.00    Types: Cigarettes   Smokeless tobacco: Never  Vaping Use  Vaping Use: Never used  Substance and Sexual Activity   Alcohol use: Not Currently   Drug use: Not Currently   Sexual activity: Not on file  Other Topics Concern   Not on file  Social History Narrative   Not on file   Social Determinants of Health   Financial Resource Strain: Not on file  Food Insecurity: Not on file  Transportation Needs: Not on file  Physical  Activity: Not on file  Stress: Not on file  Social Connections: Not on file  Intimate Partner Violence: Not on file     Family History  Problem Relation Age of Onset   Cirrhosis Father    Alcoholism Father      Current Outpatient Medications:    enoxaparin (LOVENOX) 40 MG/0.4ML injection, Inject 0.9 mLs (90 mg total) into the skin 2 (two) times daily., Disp: 54 mL, Rfl: 0   ondansetron (ZOFRAN) 4 MG tablet, Take 1 tablet (4 mg total) by mouth every 8 (eight) hours as needed for up to 10 doses for nausea or vomiting., Disp: 10 tablet, Rfl: 0   oxyCODONE-acetaminophen (PERCOCET) 5-325 MG tablet, Take 1 tablet by mouth every 8 (eight) hours as needed for up to 5 days for severe pain., Disp: 15 tablet, Rfl: 0   Physical exam:  Vitals:   09/14/21 0837  BP: 119/75  Pulse: 80  Resp: 18  Temp: 98.9 F (37.2 C)  TempSrc: Tympanic  SpO2: 100%  Weight: 236 lb 3.2 oz (107.1 kg)   Physical Exam Constitutional:      General: She is not in acute distress. Cardiovascular:     Rate and Rhythm: Normal rate and regular rhythm.     Heart sounds: Normal heart sounds.  Pulmonary:     Effort: Pulmonary effort is normal.     Breath sounds: Normal breath sounds.  Abdominal:     General: Bowel sounds are normal.     Palpations: Abdomen is soft.     Comments: Tenderness to palpation in the epigastrium on deep palpation.  Scattered bruising from Lovenox injections  Skin:    General: Skin is warm and dry.  Neurological:     Mental Status: She is alert and oriented to person, place, and time.       CMP Latest Ref Rng & Units 09/08/2021  Glucose 70 - 99 mg/dL 99  BUN 6 - 20 mg/dL 10  Creatinine 1.61 - 0.96 mg/dL 0.45  Sodium 409 - 811 mmol/L 136  Potassium 3.5 - 5.1 mmol/L 3.8  Chloride 98 - 111 mmol/L 103  CO2 22 - 32 mmol/L 28  Calcium 8.9 - 10.3 mg/dL 9.4  Total Protein 6.5 - 8.1 g/dL 7.4  Total Bilirubin 0.3 - 1.2 mg/dL 0.4  Alkaline Phos 38 - 126 U/L 69  AST 15 - 41 U/L 29  ALT  0 - 44 U/L 28   CBC Latest Ref Rng & Units 09/08/2021  WBC 4.0 - 10.5 K/uL 6.6  Hemoglobin 12.0 - 15.0 g/dL 11.7(L)  Hematocrit 36.0 - 46.0 % 37.0  Platelets 150 - 400 K/uL 352    No images are attached to the encounter.  CT Abdomen Pelvis Wo Contrast  Result Date: 09/09/2021 CLINICAL DATA:  Nausea and vomiting with abdominal pain since Friday EXAM: CT ABDOMEN AND PELVIS WITHOUT CONTRAST TECHNIQUE: Multidetector CT imaging of the abdomen and pelvis was performed following the standard protocol without IV contrast. COMPARISON:  Right upper quadrant ultrasound from earlier today FINDINGS: Lower chest:  No contributory  findings. Hepatobiliary: High-density left main portal vein. No hepatic parenchymal finding.No evidence of biliary obstruction or stone. Pancreas: Unremarkable. Spleen: Unremarkable. Adrenals/Urinary Tract: Negative adrenals. No hydronephrosis or stone. Mild bulging of the bladder at a remote abdominal wall incision, without true hernia. Stomach/Bowel: No obstruction. Appendectomy. No visible bowel inflammation Vascular/Lymphatic: No acute vascular abnormality. No mass or adenopathy. Reproductive:Symmetric ovarian size. Other: No ascites or pneumoperitoneum. Musculoskeletal: No acute abnormalities. These results were called by telephone at the time of interpretation on 09/09/2021 at 4:17 am to provider JADE SUNG , who verbally acknowledged these results. IMPRESSION: High-density left portal vein suggesting acute thrombosis, recommend liver protocol enhanced CT of the abdomen and pelvis (can omit noncontrast phase given the current study). Electronically Signed   By: Jorje Guild M.D.   On: 09/09/2021 04:19   CT ABDOMEN PELVIS W CONTRAST  Result Date: 09/09/2021 CLINICAL DATA:  40 year old female with history of epigastric pain for the past 5 days after eating chicken wings. EXAM: CT ABDOMEN AND PELVIS WITH CONTRAST TECHNIQUE: Multidetector CT imaging of the abdomen and pelvis was  performed using the standard protocol following bolus administration of intravenous contrast. CONTRAST:  179mL OMNIPAQUE IOHEXOL 300 MG/ML  SOLN COMPARISON:  CT the abdomen and pelvis 09/09/2021. FINDINGS: Lower chest: Unremarkable. Hepatobiliary: There is occlusion of the left portal vein and nearly all of the downstream portal venous branches in the left lobe of the liver. Subcentimeter low-attenuation lesion in the right lobe of the liver (axial image 44 of series 2), too small to characterize, but statistically likely to represent a small cyst. No other suspicious cystic or solid hepatic lesions. No intra or extrahepatic biliary ductal dilatation. Gallbladder is normal in appearance. Pancreas: No pancreatic mass. No pancreatic ductal dilatation. No pancreatic or peripancreatic fluid collections or inflammatory changes. Spleen: Unremarkable. Adrenals/Urinary Tract: Bilateral kidneys and adrenal glands are normal in appearance. No hydroureteronephrosis. Urinary bladder is normal in appearance. Stomach/Bowel: The appearance of the stomach is normal. No pathologic dilatation of small bowel or colon. The appendix is not confidently identified and may be surgically absent. Regardless, there are no inflammatory changes noted adjacent to the cecum to suggest the presence of an acute appendicitis at this time. Vascular/Lymphatic: No significant atherosclerotic disease, aneurysm or dissection noted in the abdominal or pelvic vasculature. No lymphadenopathy noted in the abdomen or pelvis. Reproductive: Uterus and ovaries are unremarkable in appearance. Other: No significant volume of ascites.  No pneumoperitoneum. Musculoskeletal: There are no aggressive appearing lytic or blastic lesions noted in the visualized portions of the skeleton. IMPRESSION: 1. 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 2. No other acute findings are noted elsewhere in the abdomen or pelvis to account for the  patient's symptoms. Electronically Signed   By: Vinnie Langton M.D.   On: 09/09/2021 07:23   US ABDOMEN LIMITED RUQ (LIVER/GB)  Result Date: 09/09/2021 CLINICAL DATA:  Upper abdominal pain for several days EXAM: ULTRASOUND ABDOMEN LIMITED RIGHT UPPER QUADRANT COMPARISON:  None. FINDINGS: Gallbladder: No gallstones or wall thickening visualized. No sonographic Murphy sign noted by sonographer. Common bile duct: Diameter: 4.3 mm Liver: No focal lesion identified. Within normal limits in parenchymal echogenicity. Portal vein is patent on color Doppler imaging with normal direction of blood flow towards the liver. Other: None. IMPRESSION: No acute abnormality noted. Electronically Signed   By: Inez Catalina M.D.   On: 09/09/2021 01:51    Assessment and plan- Patient is a 40 y.o. female with acute portal venous  thrombosis  Etiology of acute portal venous thrombosis unclear.  No known autoimmune disorders or use of birth control.  No baseline cirrhosis or chronic liver disease.  LFTs are normal.  I will pursue a hypercoagulable work-up at this time including protein C, protein S, Antithrombin III, factor V Leiden, prothrombin gene mutation as well as antiphospholipid antibody syndrome testing and JAK2 mutation.  Given her young age of developing portal venous thrombosis I would also like to get MRI abdomen with and without contrast to characterize her liver better and refer her to GI to see if there is anything else that needs to be done to figure out the etiology.  Patient will stay on Lovenox at this time for her anticoagulation.  I would like her to stay on anticoagulation for at least 6 months and after that I will plan to stop it and repeat CT at that time.  I will see her back in early January to discuss the results of hypercoagulable work-up but that would not change her present management.   Thank you for this kind referral and the opportunity to participate in the care of this  patient   Visit Diagnosis 1. Portal vein thrombosis     Dr. Randa Evens, MD, MPH [facility] [id number] 09/14/2021  9:49 AM

## 2021-09-15 LAB — PROTEIN C, TOTAL: Protein C, Total: 105 % (ref 60–150)

## 2021-09-15 LAB — ANA COMPREHENSIVE PANEL
Anti JO-1: <0.2 AI (ref 0.0–0.9)
Centromere Ab Screen: <0.2 AI (ref 0.0–0.9)
Chromatin Ab SerPl-aCnc: <0.2 AI (ref 0.0–0.9)
ENA SM Ab Ser-aCnc: <0.2 AI (ref 0.0–0.9)
Ribonucleic Protein: <0.2 AI (ref 0.0–0.9)
SSA (Ro) (ENA) Antibody, IgG: <0.2 AI (ref 0.0–0.9)
SSB (La) (ENA) Antibody, IgG: <0.2 AI (ref 0.0–0.9)
Scleroderma (Scl-70) (ENA) Antibody, IgG: <0.2 AI (ref 0.0–0.9)
ds DNA Ab: 1 IU/mL (ref 0–9)

## 2021-09-16 LAB — BETA-2-GLYCOPROTEIN I ABS, IGG/M/A
Beta-2 Glyco I IgG: <9 GPI IgG units (ref 0–20)
Beta-2-Glycoprotein I IgA: <9 GPI IgA units (ref 0–25)
Beta-2-Glycoprotein I IgM: 11 GPI IgM units (ref 0–32)

## 2021-09-16 LAB — LUPUS ANTICOAGULANT
DRVVT: 31.2 s (ref 0.0–47.0)
PTT Lupus Anticoagulant: 38.2 s (ref 0.0–51.9)
Thrombin Time: 19.4 s (ref 0.0–23.0)
dPT Confirm Ratio: 0.86 Ratio (ref 0.00–1.34)
dPT: 37.3 s (ref 0.0–47.6)

## 2021-09-16 LAB — PROTEIN S PANEL
Protein S Activity: 88 % (ref 63–140)
Protein S Ag, Free: 107 % (ref 61–136)
Protein S Ag, Total: 92 % (ref 60–150)

## 2021-09-16 LAB — HEXAGONAL PHASE PHOSPHOLIPID: Hex Phosph Neut Test: 5 s (ref 0–11)

## 2021-09-16 LAB — HEX PHASE PHOSPHOLIPID REFLEX

## 2021-09-17 ENCOUNTER — Telehealth: Payer: Self-pay | Admitting: Oncology

## 2021-09-17 LAB — JAK2 GENOTYPR

## 2021-09-17 LAB — CARDIOLIPIN ANTIBODIES, IGG, IGM, IGA
Anticardiolipin IgA: <9 APL U/mL (ref 0–11)
Anticardiolipin IgG: <9 GPL U/mL (ref 0–14)
Anticardiolipin IgM: 11 MPL U/mL (ref 0–12)

## 2021-09-17 NOTE — Telephone Encounter (Signed)
[facility] and pt insurance does not cover. Needs a Center that takes [place].Please her a call back at [phone]

## 2021-09-21 LAB — FACTOR 5 LEIDEN

## 2021-09-21 LAB — PROTHROMBIN GENE MUTATION

## 2021-09-24 ENCOUNTER — Ambulatory Visit: Payer: BC Managed Care – PPO

## 2021-10-03 ENCOUNTER — Inpatient Hospital Stay: Admission: RE | Admit: 2021-10-03 | Payer: BC Managed Care – PPO | Source: Ambulatory Visit

## 2021-10-06 ENCOUNTER — Other Ambulatory Visit: Payer: Self-pay | Admitting: *Deleted

## 2021-10-06 ENCOUNTER — Telehealth: Payer: Self-pay | Admitting: *Deleted

## 2021-10-06 MED ORDER — ENOXAPARIN SODIUM 100 MG/ML IJ SOSY: 100.0000 mg | PREFILLED_SYRINGE | Freq: Two times a day (BID) | INTRAMUSCULAR | 0 refills | Status: DC

## 2021-10-06 NOTE — Telephone Encounter (Signed)
I called the pt. To let her know that we are refilling lovenox but I had to change her to lovenox 100 mg bid every q 12 hours and based on her wt. That it should be 100 mg . Pt states that she is agreeable and I sent 1 month supply in

## 2021-10-17 ENCOUNTER — Ambulatory Visit
Admission: RE | Admit: 2021-10-17 | Discharge: 2021-10-17 | Disposition: A | Discharge status: Home / Self Care | Payer: BC Managed Care – PPO | Source: Ambulatory Visit | Attending: Oncology | Admitting: Oncology

## 2021-10-17 ENCOUNTER — Other Ambulatory Visit: Payer: Self-pay

## 2021-10-17 DIAGNOSIS — I81 Portal vein thrombosis: Secondary | ICD-10-CM

## 2021-10-17 IMAGING — MR MR ABDOMEN WO/W CM
12 of 17 series · 26 of 48 positions shown · IV contrast (20 ML MULTIHANCE)
Comparison: CT of [DATE].

CLINICAL DATA: Portal vein thrombosis on prior CT. Recent history
of epigastric pain.

EXAM:
MRI ABDOMEN WITHOUT AND WITH CONTRAST
TECHNIQUE: Multiplanar multisequence MR imaging of the abdomen was performed
both before and after the administration of intravenous contrast.
CONTRAST:  20mL MULTIHANCE GADOBENATE DIMEGLUMINE 529 MG/ML IV SOLN

[Series 3: cor haste · coronal · 5.0mm · 0.68mm/px · 1 of 34 slices shown]
[im 1/34]
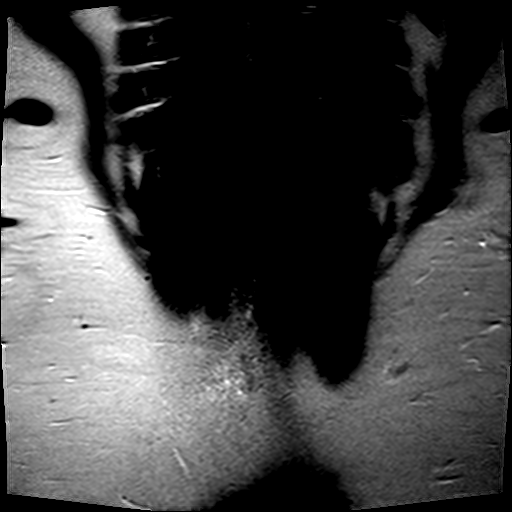

[Series 4: axial haste · axial · 6.0mm · 0.68mm/px · 1 of 41 slices shown]
[im 1/41]
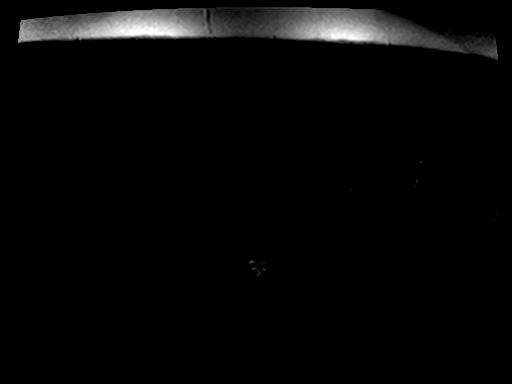

[Series 5: T2 fat-sat · axial · 6.0mm · 1.19mm/px · 1 of 37 slices shown]
[im 1/37]
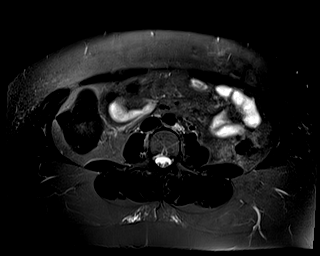

[Series 6: T1 · axial · 6.0mm · 0.74mm/px · z∈[-93,+170]mm · 2 of 82 slices shown]
[im 1/82]
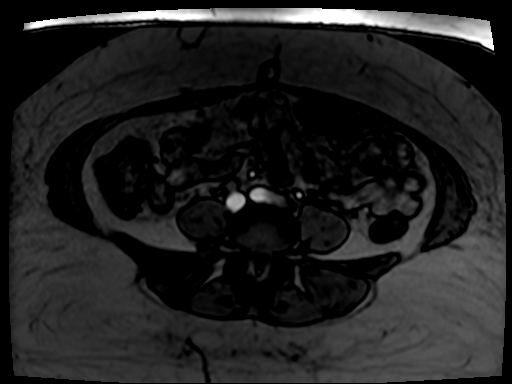
[im 82/82]
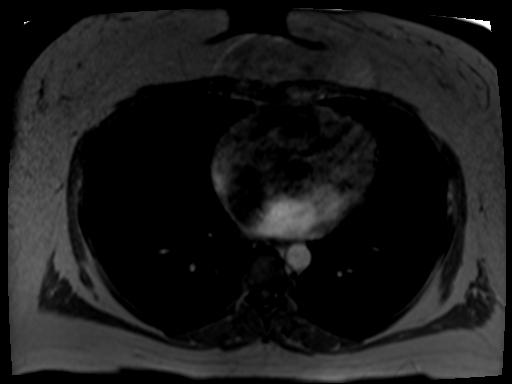

[Series 7: bSSFP · axial · 4.0mm · 0.68mm/px · 1 of 63 slices shown]
[im 1/63]
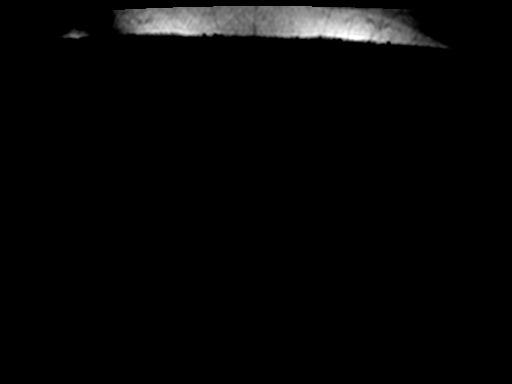

[Series 8: ep2d_diff_b50_500_800_p2_trig · axial · 6.0mm · 1.98mm/px · z∈[-33,+218]mm · 2 of 108 slices shown]
[im 1/108]
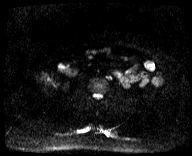
[im 108/108]
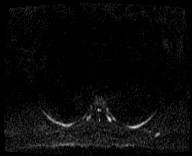

[Series 9: ep2d_diff_b50_500_800_p2_trig_adc · axial · 6.0mm · 1.98mm/px · 1 of 36 slices shown]
[im 1/36]
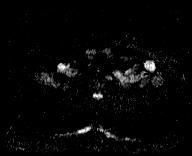

[Series 10: T1 dynamic · axial · non-contrast · 2.5mm · 0.74mm/px · z∈[-99,+178]mm · 4 of 112 slices shown]
[im 1/112]
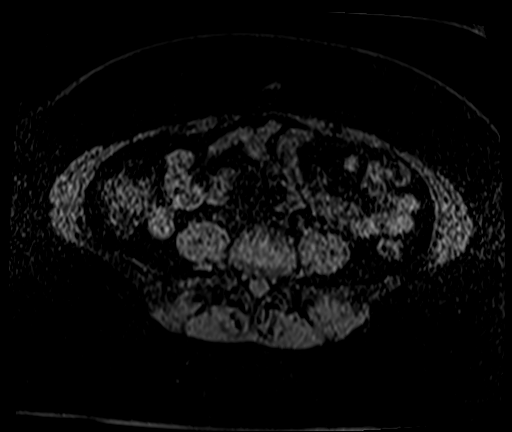
[im 38/112]
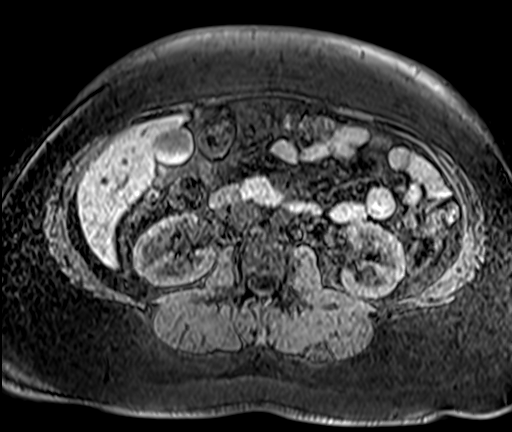
[im 75/112]
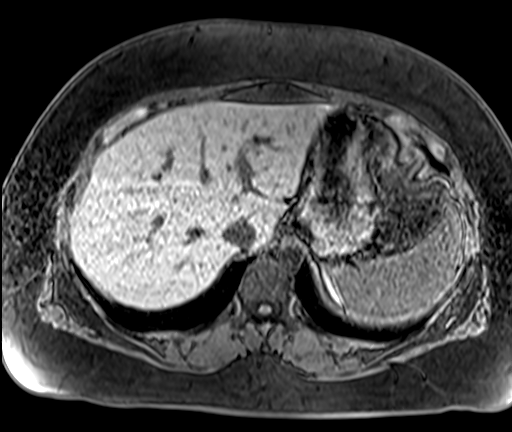
[im 112/112]
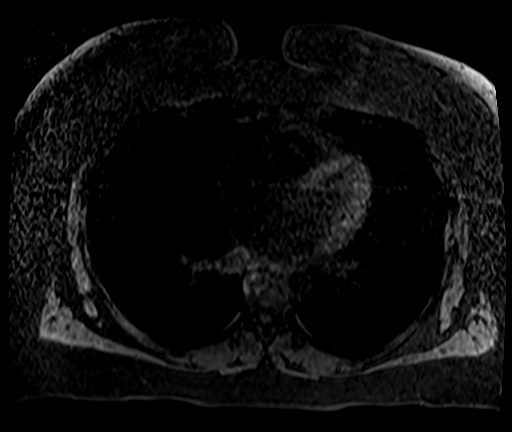

[Series 11: T1 dynamic post-contrast · axial · 2.5mm · 0.74mm/px · z∈[-99,+178]mm · 4 of 112 slices shown (1 of 4)]
[im 1/112]
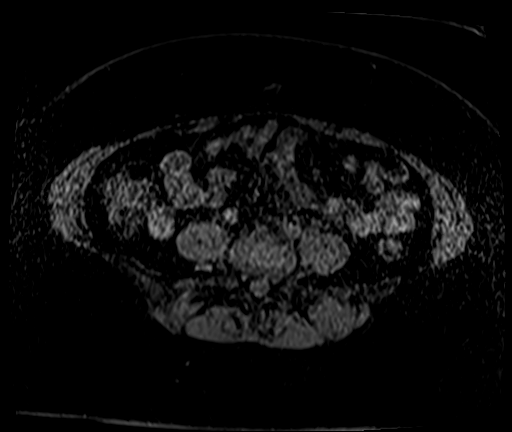
[im 38/112]
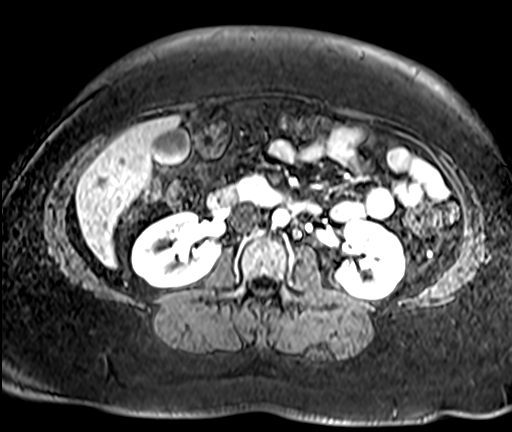
[im 75/112]
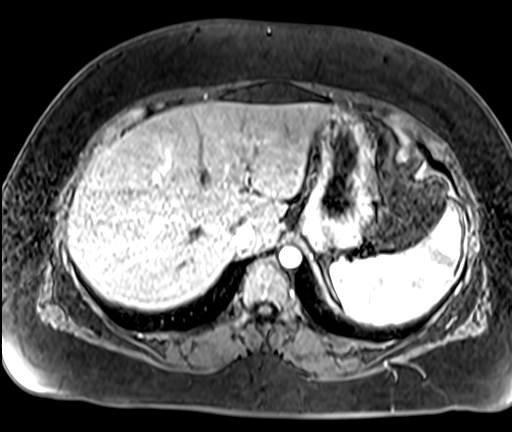
[im 112/112]
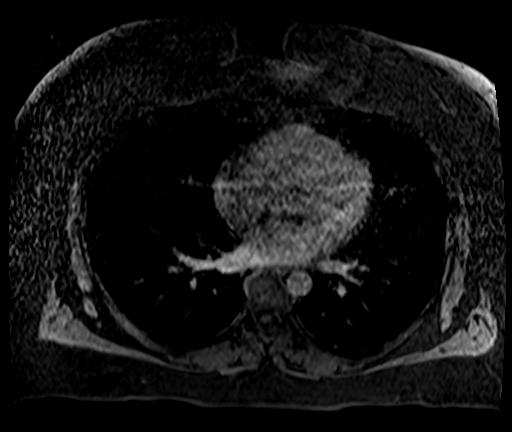

[Series 12: T1 dynamic post-contrast · axial · 2.5mm · 0.74mm/px · z∈[-99,+178]mm · 4 of 112 slices shown (2 of 4)]
[im 1/112]
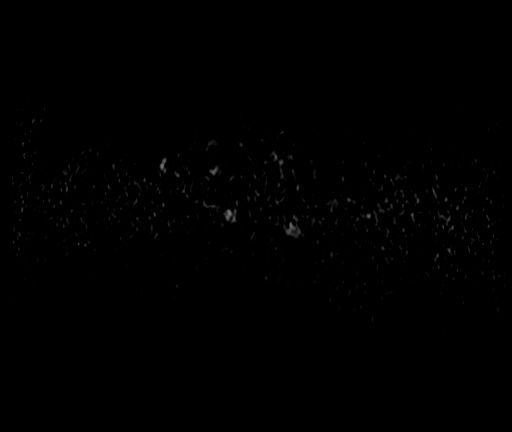
[im 38/112]
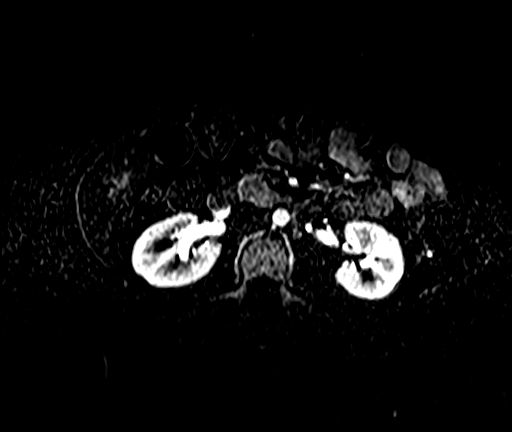
[im 75/112]
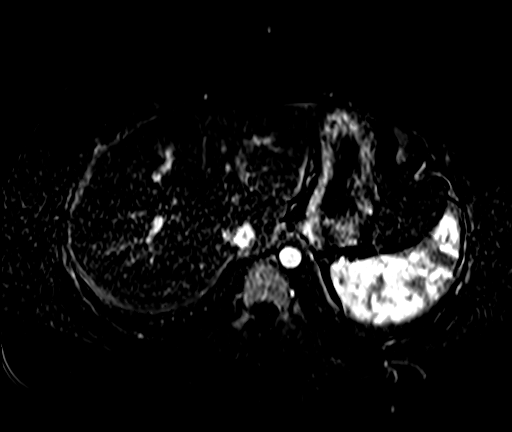
[im 112/112]
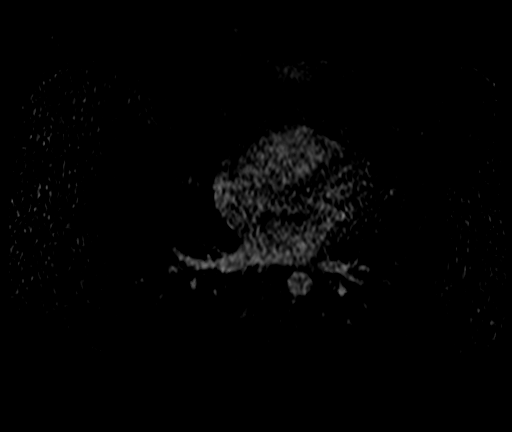

[Series 13: T1 dynamic post-contrast · axial · 2.5mm · 0.74mm/px · z∈[-99,+178]mm · 4 of 112 slices shown (3 of 4)]
[im 1/112]
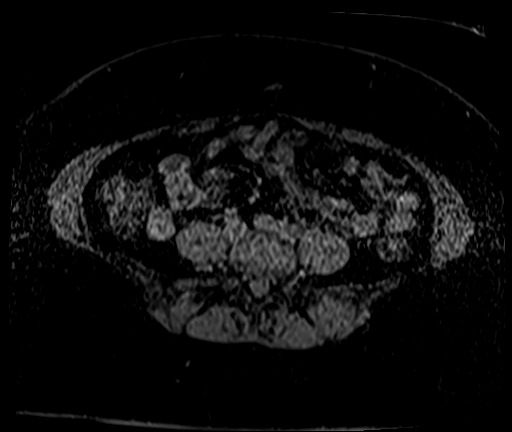
[im 38/112]
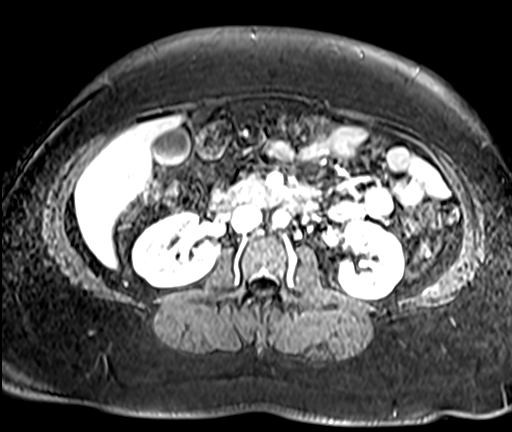
[im 75/112]
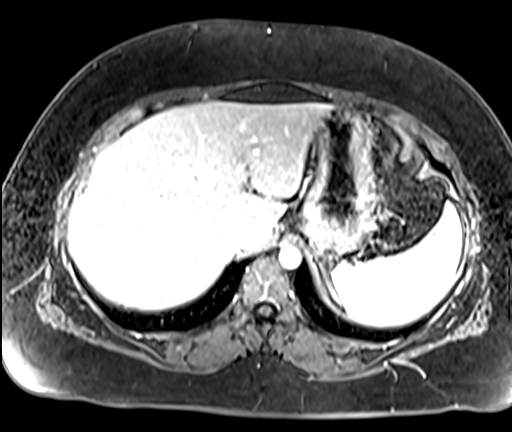
[im 112/112]
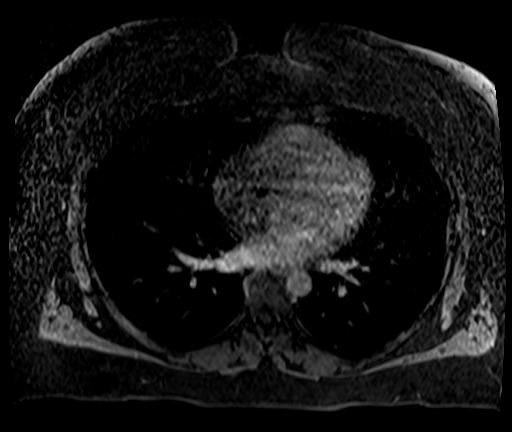

[Series 14: T1 dynamic post-contrast · axial · 2.5mm · 0.74mm/px · 1 of 112 slices shown (4 of 4)]
[im 1/112]
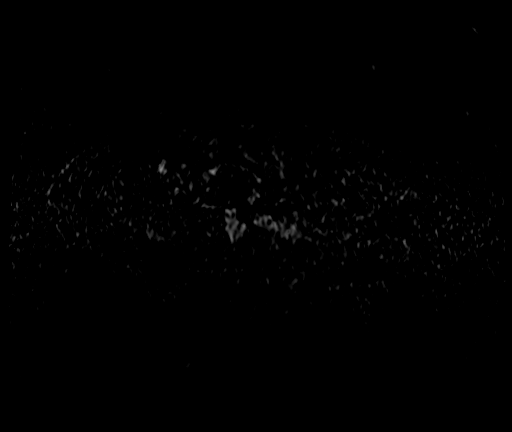

[26 of 48 positions shown; findings below may reference images not displayed]

FINDINGS: Lower chest: Normal heart size without pericardial or pleural
effusion.

Hepatobiliary: No cirrhosis. Tiny right hepatic lobe cyst of 4 mm.
Hepatomegaly at 19.2 cm. Normal gallbladder, without biliary ductal
dilatation.

Pancreas:  Normal, without mass or ductal dilatation.

Spleen:  Normal in size, without focal abnormality.

Adrenals/Urinary Tract: Normal adrenal glands. Normal kidneys,
without hydronephrosis.

Stomach/Bowel: Normal stomach and abdominal bowel loops.

Vascular/Lymphatic: Normal caliber of the aorta and branch vessels.
The previously described left portal vein thrombus has resolved.
Patent portal vein apparent on 41/13. The medial segment left portal
vein branches are not delineated, and may be chronically thrombosed.
Splenic vein and superior mesenteric vein patent. No retroperitoneal
or retrocrural adenopathy.

Other:  No ascites.

Musculoskeletal: No acute osseous abnormality.
IMPRESSION: 1. Resolution of previously described left portal vein thrombosis.
Lack of opacification of medial segment left portal vein branches
likely represents chronic occlusion.
2. Hepatomegaly.

## 2021-10-17 MED ORDER — GADOBENATE DIMEGLUMINE 529 MG/ML IV SOLN
20.0000 mL | Freq: Once | INTRAVENOUS | Status: AC | PRN
  Administered 2021-10-17: 20 mL via INTRAVENOUS

## 2021-10-20 ENCOUNTER — Inpatient Hospital Stay: Payer: BC Managed Care – PPO | Attending: Oncology | Admitting: Oncology

## 2021-10-20 ENCOUNTER — Other Ambulatory Visit: Payer: Self-pay | Admitting: *Deleted

## 2021-10-20 ENCOUNTER — Encounter: Payer: Self-pay | Admitting: Oncology

## 2021-10-20 ENCOUNTER — Telehealth: Payer: Self-pay | Admitting: *Deleted

## 2021-10-20 DIAGNOSIS — I81 Portal vein thrombosis: Secondary | ICD-10-CM | POA: Diagnosis not present

## 2021-10-20 NOTE — Progress Notes (Signed)
Patient called/ pre- screened for virtual appoinment today with oncologist. Concerns of lovenox injection not being covered by insurance, asks for Dr Smith Robert to replace rx.

## 2021-10-20 NOTE — Telephone Encounter (Signed)
I was asked by Dr. Elroy Channel team to submit a PA for Lovenox for this patient. Amy Maxwell Key: [id number] - PA Case ID: [id number] PA submitted via covermymeds.   Outcome: Generic Lovenox is preferred.

## 2021-10-20 NOTE — Telephone Encounter (Signed)
Pt called and said that she needs refill of lovenox and she states that insurance will not cover it. I called [facility] and spoke to pharmacist [facility]. She states that it is a new thing with [facility] that after you get first Rx and you need it continued then they make the order to be mailed to the home for all future refills of same med. The problem is that she will be out this evening and the mail in refill was never started. She tried to override the system and it will not allow. She is going to reach out to the mail in dept and see what they can do and also reach out to pt about this. I had called pt back and explained that this is the new way [facility] wants to do for pt. However it was something new and had not got all steps in place to start it. I told Amy Maxwell at pharmacy that pt was on her last one this evening. Amy Maxwell is to call the pt. I will check onh er tom. To see what happens.

## 2021-10-21 NOTE — Telephone Encounter (Signed)
I called this am to pharmacy and the pt got her lovenox today and in future it will be sent to mail in for [facility] and pt is a employee of [facility].

## 2021-10-22 NOTE — Progress Notes (Signed)
I connected with Amy Maxwell on 10/22/21 at  3:00 PM EST by video enabled telemedicine visit and verified that I am speaking with the correct person using two identifiers.   I discussed the limitations, risks, security and privacy concerns of performing an evaluation and management service by telemedicine and the availability of in-person appointments. I also discussed with the patient that there may be a patient responsible charge related to this service. The patient expressed understanding and agreed to proceed.  Other persons participating in the visit and their role in the encounter:  none  Patient's location:  home Provider's location:  work  Risk analyst Complaint: Discuss MRI and hypercoagulable work-up and further management  History of present illness: patient is a 41 year old female with past medical history significant for L5 and S1 radiculopathy but otherwise no other significant problems who presented to the ER on 09/09/2021 with symptoms of right upper quadrant/epigastric pain pain.  She had an right upper quadrant ultrasound which wasUnremarkable.  This was followed by a CT abdomen pelvis with contrast which showed a complete occlusion of the left portal vein and downstream portal venous branches.  Subcentimeter low-attenuation lesion in the right lobe of the liver too small to characterize likely representing a cyst.  No intra or extrahepatic ductal dilatation and no pancreatic abnormality.  Patient was discharged on Lovenox.  Patient has not had any prior history of thrombosis.  Remote history of birth control when she was 67 but none recently.  Her father died of cirrhosis likely secondary to alcohol use although patient denies any significant alcohol intake or illicit drug use.  She denies any significant skin rash or joint swelling.  She did see neurosurgery at [facility] clinic for her low back pain and was diagnosed with L5-S1 radiculopathy.  The cause of this is unclear.  Patient is  otherwise tolerating her Lovenox shots well.  Results of hypercoagulable work-up including protein C, protein S, Antithrombin and prothrombin gene mutation was negative.  JAK2 mutation negative.  Testing for antiphospholipid antibody panel negative.  Interval history patient is tolerating Lovenox shots well.  Denies any specific complaints at this time   Review of Systems  Constitutional:  Negative for chills, fever, malaise/fatigue and weight loss.  HENT:  Negative for congestion, ear discharge and nosebleeds.   Eyes:  Negative for blurred vision.  Respiratory:  Negative for cough, hemoptysis, sputum production, shortness of breath and wheezing.   Cardiovascular:  Negative for chest pain, palpitations, orthopnea and claudication.  Gastrointestinal:  Negative for abdominal pain, blood in stool, constipation, diarrhea, heartburn, melena, nausea and vomiting.  Genitourinary:  Negative for dysuria, flank pain, frequency, hematuria and urgency.  Musculoskeletal:  Negative for back pain, joint pain and myalgias.  Skin:  Negative for rash.  Neurological:  Negative for dizziness, tingling, focal weakness, seizures, weakness and headaches.  Endo/Heme/Allergies:  Does not bruise/bleed easily.  Psychiatric/Behavioral:  Negative for depression and suicidal ideas. The patient does not have insomnia.    Not on File  Past Medical History:  Diagnosis Date   Tobacco use disorder     Past Surgical History:  Procedure Laterality Date   CARPAL TUNNEL RELEASE Right    CESAREAN SECTION     VENTRAL HERNIA REPAIR      Social History   Socioeconomic History   Marital status: Married    Spouse name: Not on file   Number of children: Not on file   Years of education: Not on file   Highest education level: Not  on file  Occupational History   Not on file  Tobacco Use   Smoking status: Every Day    Packs/day: 0.50    Years: 2.00    Pack years: 1.00    Types: Cigarettes   Smokeless tobacco:  Never  Vaping Use   Vaping Use: Never used  Substance and Sexual Activity   Alcohol use: Not Currently   Drug use: Not Currently   Sexual activity: Not on file  Other Topics Concern   Not on file  Social History Narrative   Not on file   Social Determinants of Health   Financial Resource Strain: Not on file  Food Insecurity: Not on file  Transportation Needs: Not on file  Physical Activity: Not on file  Stress: Not on file  Social Connections: Not on file  Intimate Partner Violence: Not on file    Family History  Problem Relation Age of Onset   Cirrhosis Father    Alcoholism Father      Current Outpatient Medications:    acetaminophen (TYLENOL) 500 MG tablet, Take by mouth., Disp: , Rfl:    enoxaparin (LOVENOX) 100 MG/ML injection, Inject 1 mL (100 mg total) into the skin every 12 (twelve) hours., Disp: 60 mL, Rfl: 0   ibuprofen (ADVIL) 800 MG tablet, Take by mouth., Disp: , Rfl:    ondansetron (ZOFRAN) 4 MG tablet, Take 1 tablet (4 mg total) by mouth every 8 (eight) hours as needed for up to 10 doses for nausea or vomiting. (Patient not taking: Reported on 10/20/2021), Disp: 10 tablet, Rfl: 0  MR Abdomen W Wo Contrast  Result Date: 10/20/2021 CLINICAL DATA:  Portal vein thrombosis on prior CT. Recent history of epigastric pain. EXAM: MRI ABDOMEN WITHOUT AND WITH CONTRAST TECHNIQUE: Multiplanar multisequence MR imaging of the abdomen was performed both before and after the administration of intravenous contrast. CONTRAST:  63mL MULTIHANCE GADOBENATE DIMEGLUMINE 529 MG/ML IV SOLN COMPARISON:  CT of 09/09/2021. FINDINGS: Lower chest: Normal heart size without pericardial or pleural effusion. Hepatobiliary: No cirrhosis. Tiny right hepatic lobe cyst of 4 mm. Hepatomegaly at 19.2 cm. Normal gallbladder, without biliary ductal dilatation. Pancreas:  Normal, without mass or ductal dilatation. Spleen:  Normal in size, without focal abnormality. Adrenals/Urinary Tract: Normal adrenal  glands. Normal kidneys, without hydronephrosis. Stomach/Bowel: Normal stomach and abdominal bowel loops. Vascular/Lymphatic: Normal caliber of the aorta and branch vessels. The previously described left portal vein thrombus has resolved. Patent portal vein apparent on 41/13. The medial segment left portal vein branches are not delineated, and may be chronically thrombosed. Splenic vein and superior mesenteric vein patent. No retroperitoneal or retrocrural adenopathy. Other:  No ascites. Musculoskeletal: No acute osseous abnormality. IMPRESSION: 1. Resolution of previously described left portal vein thrombosis. 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 2. Hepatomegaly. Electronically Signed   By: Abigail Miyamoto M.D.   On: 10/20/2021 14:09    No images are attached to the encounter.   CMP Latest Ref Rng & Units 09/08/2021  Glucose 70 - 99 mg/dL 99  BUN 6 - 20 mg/dL 10  Creatinine 0.44 - 1.00 mg/dL 0.60  Sodium 135 - 145 mmol/L 136  Potassium 3.5 - 5.1 mmol/L 3.8  Chloride 98 - 111 mmol/L 103  CO2 22 - 32 mmol/L 28  Calcium 8.9 - 10.3 mg/dL 9.4  Total Protein 6.5 - 8.1 g/dL 7.4  Total Bilirubin 0.3 - 1.2 mg/dL 0.4  Alkaline Phos 38 - 126 U/L 69  AST 15 - 41 U/L 29  ALT 0 - 44 U/L 28   CBC Latest Ref Rng & Units 09/08/2021  WBC 4.0 - 10.5 K/uL 6.6  Hemoglobin 12.0 - 15.0 g/dL 11.7(L)  Hematocrit 36.0 - 46.0 % 37.0  Platelets 150 - 400 K/uL 352     Observation/objective: Appears in no acute distress over video visit today.  Breathing is nonlabored  Assessment and plan: Patient is a 41 year old female with history of portal venous thrombosis of unclear etiology here for routine follow-up  Hypercoagulable work-up was negative and the cause of portal venous thrombosis is unclear.MRI abdomen with and without contrast does not show any evidence of cirrhosis but she does have evidence of hepatomegaly.  She does have a GI appointment  coming up later this month.  Left portal venous thrombus has resolved.  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  My plan is to continue Lovenox for total of 6 months ending in late May 2022 and after that she can stop her anticoagulation.  No further work-up from hematology standpoint.  Follow-up instructions: I will get an ultrasound Doppler in June 2023 and see her thereafter  I discussed the assessment and treatment plan with the patient. The patient was provided an opportunity to ask questions and all were answered. The patient agreed with the plan and demonstrated an understanding of the instructions.   The patient was advised to call back or seek an in-person evaluation if the symptoms worsen or if the condition fails to improve as anticipated.    Visit Diagnosis: 1. Portal vein thrombosis     Dr. Randa Evens, MD, MPH [facility] Tel- [phone] 10/22/2021 10:23 AM

## 2021-10-26 ENCOUNTER — Encounter: Payer: Self-pay | Admitting: Oncology

## 2021-10-27 ENCOUNTER — Encounter: Payer: Self-pay | Admitting: *Deleted

## 2021-10-27 DIAGNOSIS — I81 Portal vein thrombosis: Secondary | ICD-10-CM | POA: Insufficient documentation

## 2021-10-28 ENCOUNTER — Telehealth: Payer: Self-pay | Admitting: *Deleted

## 2021-10-28 NOTE — Telephone Encounter (Addendum)
PA has been run through and No PA is needed (generic is preferred). Insurance just needs to approve the # of allowed syringes per month. [name] was trying to contact the patient's insurance last evening to get an extended amt of syringes. Call was dropped multiple times by insurance. [name]- any updates?

## 2021-10-28 NOTE — Telephone Encounter (Signed)
Call from [place] stating Enoxaparin needs PA done

## 2021-10-29 ENCOUNTER — Ambulatory Visit (INDEPENDENT_AMBULATORY_CARE_PROVIDER_SITE_OTHER): Payer: BC Managed Care – PPO | Admitting: Gastroenterology

## 2021-10-29 ENCOUNTER — Other Ambulatory Visit: Payer: Self-pay

## 2021-10-29 ENCOUNTER — Encounter: Payer: Self-pay | Admitting: Gastroenterology

## 2021-10-29 VITALS — BP 117/74 | HR 80 | Temp 98.9°F | Ht 64.0 in | Wt 247.0 lb

## 2021-10-29 DIAGNOSIS — I8500 Esophageal varices without bleeding: Secondary | ICD-10-CM

## 2021-10-29 DIAGNOSIS — I81 Portal vein thrombosis: Secondary | ICD-10-CM | POA: Diagnosis not present

## 2021-10-29 NOTE — Progress Notes (Signed)
Amy Mood MD, MRCP(U.K) [address]  [address]  [place], [place] [address]  Main: [phone]  Fax: [phone]   Gastroenterology Consultation  Referring Provider:     Dr Amy Maxwell  Primary Care Physician:  Amy Maxwell Primary Gastroenterologist:  Dr. Wyline Maxwell  Reason for Consultation:     PVT        HPI:   Amy Maxwell is a 41 y.o. y/o female referred for consultation & management  by Dr. Smith Maxwell .   She presented to hematology after an episode of right upper quadrant epigastric pain in November 2022.  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  Discharged on Lovenox.  The patient is undergoing hypercoagulable work-up.  Referred to GI to see if anything else we need to do.  10/20/2020: MRI abdomen shows resolution of previously described left portal vein thrombosis.  Hepatomegaly. 09/09/2021: INR 1.0 CMP normal.  CBC shows a hemoglobin 11.7 and normal platelet count. Presently she is doing well with no issues.  Denies any excess alcohol consumption.  Father had cirrhosis of the liver but he was someone who comes in with large quantities of alcohol.  Denies any use of contraceptives recently.  Had used the Depo at age 13.  She recollects that the first week of January her daughter had COVID-19.  She herself had a respiratory illness but tested twice negative on home tests.  She presently denies any abdominal pain or any other GI issues.  Past Medical History:  Diagnosis Date   Tobacco use disorder     Past Surgical History:  Procedure Laterality Date   CARPAL TUNNEL RELEASE Right    CESAREAN SECTION     VENTRAL HERNIA REPAIR      Prior to Admission medications   Medication Sig Start Date End Date Taking? Authorizing Provider  acetaminophen (TYLENOL) 500 MG tablet Take by mouth.    [provider]  enoxaparin (LOVENOX) 100 MG/ML injection Inject 1 mL (100  mg total) into the skin every 12 (twelve) hours. 10/06/21   Rickard Patience, MD  ibuprofen (ADVIL) 800 MG tablet Take by mouth.    [provider]  ondansetron (ZOFRAN) 4 MG tablet Take 1 tablet (4 mg total) by mouth every 8 (eight) hours as needed for up to 10 doses for nausea or vomiting. Patient not taking: Reported on 10/20/2021 09/09/21   Gilles Chiquito, MD    Family History  Problem Relation Age of Onset   Cirrhosis Father    Alcoholism Father      Social History   Tobacco Use   Smoking status: Every Day    Packs/day: 0.50    Years: 2.00    Pack years: 1.00    Types: Cigarettes   Smokeless tobacco: Never  Vaping Use   Vaping Use: Never used  Substance Use Topics   Alcohol use: Not Currently   Drug use: Not Currently    Allergies as of 10/29/2021   (Not on File)    Review of Systems:    All systems reviewed and negative except where noted in HPI.   Physical Exam:  There were no vitals taken for this visit. No LMP recorded. Psych:  Alert and cooperative. Normal Maxwell and affect. General:   Alert,  Well-developed, well-nourished, pleasant and cooperative in NAD Head:  Normocephalic and atraumatic. Eyes:  Sclera clear, no icterus.   Conjunctiva pink.  Ears:  Normal auditory acuity. Neurologic:  Alert and oriented x3;  grossly normal neurologically. Skin:  Intact without significant lesions or rashes. No jaundice. Lymph Nodes:  No significant cervical adenopathy. Psych:  Alert and cooperative. Normal Maxwell and affect.  Imaging Studies: MR Abdomen W Wo Contrast  Result Date: 10/20/2021 CLINICAL DATA:  Portal vein thrombosis on prior CT. Recent history of epigastric pain. EXAM: MRI ABDOMEN WITHOUT AND WITH CONTRAST TECHNIQUE: Multiplanar multisequence MR imaging of the abdomen was performed both before and after the administration of intravenous contrast. CONTRAST:  40mL MULTIHANCE GADOBENATE DIMEGLUMINE 529 MG/ML IV SOLN COMPARISON:  CT of 09/09/2021. FINDINGS: Lower  chest: Normal heart size without pericardial or pleural effusion. Hepatobiliary: No cirrhosis. Tiny right hepatic lobe cyst of 4 mm. Hepatomegaly at 19.2 cm. Normal gallbladder, without biliary ductal dilatation. Pancreas:  Normal, without mass or ductal dilatation. Spleen:  Normal in size, without focal abnormality. Adrenals/Urinary Tract: Normal adrenal glands. Normal kidneys, without hydronephrosis. Stomach/Bowel: Normal stomach and abdominal bowel loops. Vascular/Lymphatic: Normal caliber of the aorta and branch vessels. The previously described left portal vein thrombus has resolved. Patent portal vein apparent on 41/13. The medial segment left portal vein branches are not delineated, and may be chronically thrombosed. Splenic vein and superior mesenteric vein patent. No retroperitoneal or retrocrural adenopathy. Other:  No ascites. Musculoskeletal: No acute osseous abnormality. IMPRESSION: 1. Resolution of previously described left portal vein thrombosis. 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 2. Hepatomegaly. Electronically Signed   By: Jeronimo Greaves M.D.   On: 10/20/2021 14:09    Assessment and Plan:   Amy Maxwell is a 41 y.o. y/o female has been referred for Portal vein thrombosis evaluation.  Often it is present and cirrhotics due to the imbalance between factors that promote coagulation and dose which prevent coagulation produced by the liver.  In her case that no clear evidence of cirrhosis on MRI or biochemically.  In noncirrhotic's it is often a prothrombotic state due to hypercoagulability.  Often 25% of cases no causes identified.  In her case hypercoagulable work-up has been negative and the only cause for portal vein thrombosis that I could guess would be a COVID-19 infection which her daughter had about a week or 2 prior to her presentation.  Although she tested home negative it is very likely possible that she had an infection  as she had respiratory symptoms and thought she had the flu.  MRI repeated in January 2023 shows no evidence of portal vein thrombosis at this point of time as it is resolved.  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  She is going to continue Lovenox till May 2022 then stop.  She is due to have a Doppler ultrasound in 2023.  From the GI point of view I would suggest for screening for esophageal varices.  If none are seen I will repeat her labs in July after her Doppler  to ensure her LFTs, PT/INR and albumin showed no evidence of liver dysfunction or abnormalities.   I have discussed alternative options, risks & benefits,  which include, but are not limited to, bleeding, infection, perforation,respiratory complication & drug reaction.  The patient agrees with this plan & written consent will be obtained.    Follow up in 6 months   Dr Amy Mood MD,MRCP(U.K)

## 2021-10-30 ENCOUNTER — Other Ambulatory Visit: Payer: Self-pay

## 2021-10-30 ENCOUNTER — Other Ambulatory Visit: Payer: Self-pay | Admitting: *Deleted

## 2021-10-30 ENCOUNTER — Telehealth: Payer: Self-pay | Admitting: *Deleted

## 2021-10-30 MED ORDER — APIXABAN 5 MG PO TABS: 5.0000 mg | ORAL_TABLET | Freq: Two times a day (BID) | ORAL | 3 refills | Status: DC

## 2021-10-30 NOTE — Telephone Encounter (Signed)
Called pt to tell her about the lovenox and she does not meet guidelines to get anymore lovenox and they will not over ride because she does not meet criteria to be approved. Spoke to [employer] and she states that she will put her on eliquis and it is 5 mg bid .  She picked it up and no problem. I wanted there to know that  while on eliquis that if she had bad fall especially hitther head  she should be seen by medical person to check to see if she has bleeding internally. Also if she feel and hurt her ribs and look for bruising then get checked out. She states that she is clumsy and she wants to know if there are certain things she does not have to worry about. Like if she bumped into wall, or cabinet and has bruise on arms or legs. If she tripped and hurt her leg and had bruise but not a bad one but if anything is hurting bad  she should have it checked out. She understands

## 2021-11-10 ENCOUNTER — Encounter: Payer: Self-pay | Admitting: Gastroenterology

## 2021-11-11 ENCOUNTER — Ambulatory Visit
Admission: RE | Admit: 2021-11-11 | Discharge: 2021-11-11 | Disposition: A | Discharge status: Home / Self Care | Payer: BC Managed Care – PPO | Source: Ambulatory Visit | Attending: Gastroenterology | Admitting: Gastroenterology

## 2021-11-11 ENCOUNTER — Ambulatory Visit: Payer: BC Managed Care – PPO | Admitting: Anesthesiology

## 2021-11-11 ENCOUNTER — Encounter
Admission: RE | Disposition: A | Discharge status: Home / Self Care | Payer: Self-pay | Source: Ambulatory Visit | Attending: Gastroenterology

## 2021-11-11 DIAGNOSIS — I8500 Esophageal varices without bleeding: Secondary | ICD-10-CM

## 2021-11-11 SURGERY — ESOPHAGOGASTRODUODENOSCOPY (EGD) WITH PROPOFOL
Anesthesia: General

## 2021-11-11 MED ORDER — SODIUM CHLORIDE 0.9 % IV SOLN: INTRAVENOUS | Status: DC

## 2021-11-11 NOTE — Procedures (Shared)
Patient took Eliquis yesterday. Dr. Tobi Bastos notified and unable to perform procedure today. Explained to patient and she voiced understanding and said the Lovenox was too expensive to buy.

## 2022-02-11 ENCOUNTER — Telehealth: Payer: Self-pay | Admitting: *Deleted

## 2022-02-11 ENCOUNTER — Other Ambulatory Visit: Payer: Self-pay | Admitting: *Deleted

## 2022-02-11 NOTE — Telephone Encounter (Signed)
I called back to [place] and spoke to [facility] and told her that she was only able to get the lovenox in the beginning and then had to try to get auth and she did not meet the criteria and we changed her to eliquis and she got a card that covers her for 10 dollars  each time. So please d/c this order for lovenox and she will do that ?

## 2022-02-11 NOTE — Telephone Encounter (Signed)
Received call from [facility] that insurance continues to deny Lovenox 100mg .  States prior authorization was done and denied.  Needs appeal to [phone].  ?RN and CMA on Dr team notified.  ?

## 2022-04-05 ENCOUNTER — Ambulatory Visit: Payer: BC Managed Care – PPO

## 2022-04-07 ENCOUNTER — Inpatient Hospital Stay: Payer: BC Managed Care – PPO | Admitting: Oncology

## 2022-04-16 ENCOUNTER — Encounter: Payer: Self-pay | Admitting: Oncology

## 2022-04-28 ENCOUNTER — Telehealth: Payer: BC Managed Care – PPO | Admitting: Gastroenterology

## 2022-04-28 ENCOUNTER — Ambulatory Visit: Payer: BC Managed Care – PPO | Admitting: Gastroenterology

## 2022-04-28 NOTE — Progress Notes (Deleted)
Wyline Mood , MD [address]  [address]  [place], [place] [address]  Main: [phone]  Fax: [phone]   Primary Care Physician: Nira Retort  Virtual Visit via Video Note  I connected with patient on 04/28/22 at  2:45 PM EDT by video and verified that I am speaking with the correct person using two identifiers.   I discussed the limitations, risks, security and privacy concerns of performing an evaluation and management service by video  and the availability of in person appointments. I also discussed with the patient that there may be a patient responsible charge related to this service. The patient expressed understanding and agreed to proceed.  Location of Patient: Home Location of Provider: Home Persons involved: Patient and provider only   History of Present Illness: No chief complaint on file.   HPI: Amy Maxwell is a 41 y.o. 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 She presented to hematology after an episode of right upper quadrant epigastric pain in November 2022.  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  Discharged on Lovenox.  The patient is undergoing hypercoagulable work-up.  Referred to GI to see if anything else we need to do.   10/20/2020: MRI abdomen shows resolution of previously described left portal vein thrombosis.  Hepatomegaly. 09/09/2021: INR 1.0 CMP normal.  CBC shows a hemoglobin 11.7 and normal platelet count. Presently she is doing well with no issues.  Denies any excess alcohol consumption.  Father had cirrhosis of the liver but he was someone who comes in with large quantities of alcohol.  Denies any use of contraceptives recently.  Had used the Depo at age 33.  She recollects that the first week of January her daughter had COVID-19.  She herself had a respiratory illness but tested twice  negative on home tests. At initial visit  denies any abdominal pain or any other GI issues.  Interval history   10/29/2021-04/28/2022    She cancelled her EGD in 10/2021.No recent labs.    ***      Current Outpatient Medications  Medication Sig Dispense Refill   apixaban (ELIQUIS) 5 MG TABS tablet Take 1 tablet (5 mg total) by mouth 2 (two) times daily. 60 tablet 3   gabapentin (NEURONTIN) 100 MG capsule Take 100 mg by mouth 3 (three) times daily.     traMADol (ULTRAM) 50 MG tablet Take 50 mg by mouth every 6 (six) hours as needed.     No current facility-administered medications for this visit.    Allergies as of 04/28/2022   (No Known Allergies)    Review of Systems:    All systems reviewed and negative except where noted in HPI.  General Appearance:    Alert, cooperative, no distress, appears stated age  Head:    Normocephalic, without obvious abnormality, atraumatic  Eyes:    PERRL, conjunctiva/corneas clear,  Ears:    Grossly normal hearing    Neurologic:  Grossly normal    Observations/Objective:  Labs: CMP     Component Value Date/Time   NA 136 09/08/2021 1802   K 3.8 09/08/2021 1802   CL 103 09/08/2021 1802   CO2 28 09/08/2021 1802   GLUCOSE 99 09/08/2021 1802   BUN 10 09/08/2021 1802   CREATININE 0.60 09/08/2021 1802   CALCIUM 9.4 09/08/2021 1802   PROT  7.4 09/08/2021 1802   ALBUMIN 4.2 09/08/2021 1802   AST 29 09/08/2021 1802   ALT 28 09/08/2021 1802   ALKPHOS 69 09/08/2021 1802   BILITOT 0.4 09/08/2021 1802   GFRNONAA >60 09/08/2021 1802   GFRAA >60 02/15/2018 0038   Lab Results  Component Value Date   WBC 6.6 09/08/2021   HGB 11.7 (L) 09/08/2021   HCT 37.0 09/08/2021   MCV 86.7 09/08/2021   PLT 352 09/08/2021    Imaging Studies: No results found.  Assessment and Plan:   [name] is a 41 y.o. y/o female here to follow up  for Portal vein thrombosis evaluation.  Often it is present in  cirrhotics due to the imbalance between factors  that promote coagulation and dose which prevent coagulation produced by the liver.  In her case that no clear evidence of cirrhosis on MRI or biochemically.  In noncirrhotic's it is often a prothrombotic state due to hypercoagulability.  Often 25% of cases no causes identified.  In her case hypercoagulable work-up has been negative and the only cause for portal vein thrombosis that I could guess would be a COVID-19 infection which her daughter had about a week or 2 prior to her presentation.  Although she tested home negative it is very likely possible that she had an infection as she had respiratory symptoms and thought she had the flu.  MRI repeated in January 2023 shows no evidence of portal vein thrombosis at this point of time as it is resolved.  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  She is going to continue Lovenox till May 2022 then stop.  She is due to have a Doppler ultrasound in 2023.   From the GI point of view I would suggest for screening for esophageal varices.  If none are seen I will repeat her labs in July after her Doppler  to ensure her LFTs, PT/INR and albumin showed no evidence of liver dysfunction or abnormalities.        I discussed the assessment and treatment plan with the patient. The patient was provided an opportunity to ask questions and all were answered. The patient agreed with the plan and demonstrated an understanding of the instructions.   The patient was advised to call back or seek an in-person evaluation if the symptoms worsen or if the condition fails to improve as anticipated.  I provided *** minutes of face-to-face time during this encounter.  Dr Wyline Mood MD,MRCP [facility]) Gastroenterology/Hepatology Pager: [phone]   Speech recognition software was used to dictate this note.

## 2023-01-25 ENCOUNTER — Emergency Department
Admission: EM | Admit: 2023-01-25 | Discharge: 2023-01-25 | Disposition: A | Discharge status: Home / Self Care | Payer: BC Managed Care – PPO | Attending: Emergency Medicine | Admitting: Emergency Medicine

## 2023-01-25 ENCOUNTER — Emergency Department: Payer: BC Managed Care – PPO

## 2023-01-25 DIAGNOSIS — R1084 Generalized abdominal pain: Secondary | ICD-10-CM | POA: Insufficient documentation

## 2023-01-25 DIAGNOSIS — R109 Unspecified abdominal pain: Secondary | ICD-10-CM | POA: Diagnosis present

## 2023-01-25 LAB — COMPREHENSIVE METABOLIC PANEL
ALT: 19 U/L (ref 0–44)
AST: 24 U/L (ref 15–41)
Albumin: 4 g/dL (ref 3.5–5.0)
Alkaline Phosphatase: 80 U/L (ref 38–126)
Anion gap: 6 (ref 5–15)
BUN: 16 mg/dL (ref 6–20)
CO2: 21 mmol/L — ABNORMAL LOW (ref 22–32)
Calcium: 8.7 mg/dL — ABNORMAL LOW (ref 8.9–10.3)
Chloride: 109 mmol/L (ref 98–111)
Creatinine, Ser: 0.58 mg/dL (ref 0.44–1.00)
GFR, Estimated: >60 mL/min (ref >60)
Glucose, Bld: 102 mg/dL — ABNORMAL HIGH (ref 70–99)
Potassium: 3.8 mmol/L (ref 3.5–5.1)
Sodium: 136 mmol/L (ref 135–145)
Total Bilirubin: 0.6 mg/dL (ref 0.3–1.2)
Total Protein: 7.2 g/dL (ref 6.5–8.1)

## 2023-01-25 LAB — CBC
HCT: 33.9 % — ABNORMAL LOW (ref 36.0–46.0)
Hemoglobin: 9.8 g/dL — ABNORMAL LOW (ref 12.0–15.0)
MCH: 21.8 pg — ABNORMAL LOW (ref 26.0–34.0)
MCHC: 28.9 g/dL — ABNORMAL LOW (ref 30.0–36.0)
MCV: 75.3 fL — ABNORMAL LOW (ref 80.0–100.0)
Platelets: 426 10*3/uL — ABNORMAL HIGH (ref 150–400)
RBC: 4.5 MIL/uL (ref 3.87–5.11)
RDW: 17.2 % — ABNORMAL HIGH (ref 11.5–15.5)
WBC: 16.4 10*3/uL — ABNORMAL HIGH (ref 4.0–10.5)
nRBC: 0 % (ref 0.0–0.2)

## 2023-01-25 LAB — URINALYSIS, ROUTINE W REFLEX MICROSCOPIC
Bilirubin Urine: NEGATIVE
Glucose, UA: NEGATIVE mg/dL
Hgb urine dipstick: NEGATIVE
Ketones, ur: NEGATIVE mg/dL
Leukocytes,Ua: NEGATIVE
Nitrite: NEGATIVE
Protein, ur: NEGATIVE mg/dL
Specific Gravity, Urine: 1.025 (ref 1.005–1.030)
pH: 5 (ref 5.0–8.0)

## 2023-01-25 LAB — POC URINE PREG, ED: Preg Test, Ur: NEGATIVE

## 2023-01-25 LAB — LIPASE, BLOOD: Lipase: 28 U/L (ref 11–51)

## 2023-01-25 MED ORDER — SODIUM CHLORIDE 0.9 % IV BOLUS
500.0000 mL | Freq: Once | INTRAVENOUS | Status: AC
Start: 2023-01-25 — End: 2023-01-25
  Administered 2023-01-25: 500 mL via INTRAVENOUS

## 2023-01-25 MED ORDER — DICYCLOMINE HCL 10 MG PO CAPS: 10.0000 mg | ORAL_CAPSULE | Freq: Three times a day (TID) | ORAL | 0 refills | Status: DC

## 2023-01-25 MED ORDER — MORPHINE SULFATE (PF) 4 MG/ML IV SOLN
4.0000 mg | Freq: Once | INTRAVENOUS | Status: AC
  Administered 2023-01-25: 4 mg via INTRAVENOUS
  Filled 2023-01-25: qty 1

## 2023-01-25 MED ORDER — ONDANSETRON HCL 4 MG/2ML IJ SOLN
4.0000 mg | Freq: Once | INTRAMUSCULAR | Status: AC
  Administered 2023-01-25: 4 mg via INTRAVENOUS
  Filled 2023-01-25: qty 2

## 2023-01-25 MED ORDER — IOHEXOL 300 MG/ML  SOLN
100.0000 mL | Freq: Once | INTRAMUSCULAR | Status: AC | PRN
  Administered 2023-01-25: 100 mL via INTRAVENOUS

## 2023-01-25 NOTE — ED Provider Notes (Signed)
[facility] Provider Note    Event Date/Time   First MD Initiated Contact with Patient 01/25/23 1326     (approximate)   History   Abdominal Pain   HPI  Amy Maxwell is a 42 y.o. female who presents with complaints of abdominal cramping.  Patient reports this started overnight and at times has been severe.  Somewhat improved now.  No history of similar pain.  No vomiting.  No fevers.     Physical Exam   Triage Vital Signs: ED Triage Vitals  Enc Vitals Group     BP 01/25/23 1250 (!) 153/102     Pulse Rate 01/25/23 1250 (!) 118     Resp 01/25/23 1250 (!) 21     Temp 01/25/23 1250 98.5 F (36.9 C)     Temp Source 01/25/23 1250 Oral     SpO2 01/25/23 1250 99 %     Weight 01/25/23 1251 131.5 kg (290 lb)     Height --      Head Circumference --      Peak Flow --      Pain Score 01/25/23 1251 6     Pain Loc --      Pain Edu? --      Excl. in GC? --     Most recent vital signs: Vitals:   01/25/23 1250  BP: (!) 153/102  Pulse: (!) 118  Resp: (!) 21  Temp: 98.5 F (36.9 C)  SpO2: 99%     General: Awake, no distress.  CV:  Good peripheral perfusion.  Resp:  Normal effort.  Abd:  No distention.  Mild diffuse tenderness Other:     ED Results / Procedures / Treatments   Labs (all labs ordered are listed, but only abnormal results are displayed) Labs Reviewed  COMPREHENSIVE METABOLIC PANEL - Abnormal; Notable for the following components:      Result Value   CO2 21 (*)    Glucose, Bld 102 (*)    Calcium 8.7 (*)    All other components within normal limits  CBC - Abnormal; Notable for the following components:   WBC 16.4 (*)    Hemoglobin 9.8 (*)    HCT 33.9 (*)    MCV 75.3 (*)    MCH 21.8 (*)    MCHC 28.9 (*)    RDW 17.2 (*)    Platelets 426 (*)    All other components within normal limits  URINALYSIS, ROUTINE W REFLEX MICROSCOPIC - Abnormal; Notable for the following components:   Color, Urine YELLOW (*)    APPearance  HAZY (*)    All other components within normal limits  LIPASE, BLOOD  POC URINE PREG, ED     EKG     RADIOLOGY CT scan viewed interpret by me, no acute normality, pending radiology review    PROCEDURES:  Critical Care performed:   Procedures   MEDICATIONS ORDERED IN ED: Medications  sodium chloride 0.9 % bolus 500 mL (0 mLs Intravenous Stopped 01/25/23 1534)  morphine (PF) 4 MG/ML injection 4 mg (4 mg Intravenous Given 01/25/23 1415)  ondansetron (ZOFRAN) injection 4 mg (4 mg Intravenous Given 01/25/23 1415)  iohexol (OMNIPAQUE) 300 MG/ML solution 100 mL (100 mLs Intravenous Contrast Given 01/25/23 1452)     IMPRESSION / MDM / ASSESSMENT AND PLAN / ED COURSE  I reviewed the triage vital signs and the nursing notes. Patient's presentation is most consistent with acute presentation with potential threat to life or bodily function.  Patient presents with abdominal pain as detailed above, differential includes SBO, colitis, diverticulitis  She has diffuse abdominal discomfort so treated with IV morphine, IV Zofran, sent for CT abdomen pelvis  Work reviewed which demonstrates elevated white blood cell count which is nonspecific otherwise reassuring tests  CT scan is reassuring, patient is feeling much improved after treatment.  Discussed admission however patient feels comfortable with discharge and strict return precautions, will Rx analgesics         FINAL CLINICAL IMPRESSION(S) / ED DIAGNOSES   Final diagnoses:  Generalized abdominal pain     Rx / DC Orders   ED Discharge Orders          Ordered    dicyclomine (BENTYL) 10 MG capsule  3 times daily before meals & bedtime        01/25/23 1527             Note:  This document was prepared using Dragon voice recognition software and may include unintentional dictation errors.   Jene Every, MD 01/25/23 [phone]

## 2023-01-25 NOTE — ED Triage Notes (Signed)
Pt sts that she woke up with abd pain that quickly turned into N/V/D. Pt sts that the abd pain is making her not able to stand up straight and it is getting worse.

## 2023-12-16 ENCOUNTER — Other Ambulatory Visit: Payer: Self-pay | Admitting: Orthopedic Surgery

## 2023-12-16 DIAGNOSIS — M1611 Unilateral primary osteoarthritis, right hip: Secondary | ICD-10-CM

## 2023-12-16 DIAGNOSIS — M25551 Pain in right hip: Secondary | ICD-10-CM

## 2024-01-11 ENCOUNTER — Encounter: Payer: Self-pay | Admitting: Orthopedic Surgery

## 2024-01-16 ENCOUNTER — Other Ambulatory Visit

## 2024-01-17 ENCOUNTER — Ambulatory Visit
Admission: RE | Admit: 2024-01-17 | Discharge: 2024-01-17 | Disposition: A | Discharge status: Home / Self Care | Source: Ambulatory Visit | Attending: Orthopedic Surgery | Admitting: Orthopedic Surgery

## 2024-01-17 DIAGNOSIS — M25551 Pain in right hip: Secondary | ICD-10-CM

## 2024-01-17 DIAGNOSIS — M1611 Unilateral primary osteoarthritis, right hip: Secondary | ICD-10-CM

## 2024-01-17 MED ORDER — IOPAMIDOL (ISOVUE-200) INJECTION 41%
20.0000 mL | Freq: Once | INTRAVENOUS | Status: AC | PRN
  Administered 2024-01-17: 20 mL

## 2024-01-17 MED ORDER — LIDOCAINE 1 % OPTIME INJ - NO CHARGE
5.0000 mL | Freq: Once | INTRAMUSCULAR | Status: AC
Start: 2024-01-17 — End: 2024-01-17
  Administered 2024-01-17: 5 mL via INTRADERMAL

## 2024-01-17 MED ORDER — GADOBENATE DIMEGLUMINE 529 MG/ML IV SOLN
0.1000 mL | Freq: Once | INTRAVENOUS | Status: AC | PRN
  Administered 2024-01-17: 0.1 mL via INTRA_ARTICULAR

## 2024-02-09 ENCOUNTER — Other Ambulatory Visit: Payer: Self-pay | Admitting: Orthopedic Surgery

## 2024-02-09 DIAGNOSIS — G8929 Other chronic pain: Secondary | ICD-10-CM

## 2024-03-14 ENCOUNTER — Other Ambulatory Visit: Payer: Self-pay | Admitting: Orthopedic Surgery

## 2024-03-20 ENCOUNTER — Other Ambulatory Visit: Payer: Self-pay | Admitting: Family Medicine

## 2024-03-20 DIAGNOSIS — M5416 Radiculopathy, lumbar region: Secondary | ICD-10-CM

## 2024-03-21 ENCOUNTER — Encounter: Payer: Self-pay | Admitting: Family Medicine

## 2024-03-25 DIAGNOSIS — N390 Urinary tract infection, site not specified: Secondary | ICD-10-CM

## 2024-03-25 HISTORY — DX: Urinary tract infection, site not specified: N39.0

## 2024-03-26 ENCOUNTER — Other Ambulatory Visit: Payer: Self-pay

## 2024-03-26 ENCOUNTER — Encounter
Admission: RE | Admit: 2024-03-26 | Discharge: 2024-03-26 | Disposition: A | Discharge status: Home / Self Care | Source: Ambulatory Visit | Attending: Orthopedic Surgery

## 2024-03-26 ENCOUNTER — Ambulatory Visit
Admission: RE | Admit: 2024-03-26 | Discharge: 2024-03-26 | Disposition: A | Discharge status: Home / Self Care | Source: Ambulatory Visit | Attending: Family Medicine | Admitting: Family Medicine

## 2024-03-26 VITALS — BP 128/81 | HR 86 | Resp 12 | Ht 64.0 in | Wt 277.9 lb

## 2024-03-26 DIAGNOSIS — D509 Iron deficiency anemia, unspecified: Secondary | ICD-10-CM | POA: Diagnosis not present

## 2024-03-26 DIAGNOSIS — Z01812 Encounter for preprocedural laboratory examination: Secondary | ICD-10-CM | POA: Diagnosis present

## 2024-03-26 DIAGNOSIS — M5416 Radiculopathy, lumbar region: Secondary | ICD-10-CM

## 2024-03-26 HISTORY — DX: Personal history of other medical treatment: Z92.89

## 2024-03-26 HISTORY — DX: Personal history of COVID-19: Z86.16

## 2024-03-26 HISTORY — DX: Polycystic ovarian syndrome: E28.2

## 2024-03-26 HISTORY — DX: Spondylosis without myelopathy or radiculopathy, lumbar region: M47.816

## 2024-03-26 HISTORY — DX: Hepatomegaly, not elsewhere classified: R16.0

## 2024-03-26 HISTORY — DX: Other specified postprocedural states: Z98.890

## 2024-03-26 HISTORY — DX: Gastro-esophageal reflux disease without esophagitis: K21.9

## 2024-03-26 HISTORY — DX: Anemia, unspecified: D64.9

## 2024-03-26 LAB — BASIC METABOLIC PANEL WITH GFR
Anion gap: 9 (ref 5–15)
BUN: 15 mg/dL (ref 6–20)
CO2: 23 mmol/L (ref 22–32)
Calcium: 8.9 mg/dL (ref 8.9–10.3)
Chloride: 108 mmol/L (ref 98–111)
Creatinine, Ser: 0.56 mg/dL (ref 0.44–1.00)
GFR, Estimated: >60 mL/min (ref >60)
Glucose, Bld: 95 mg/dL (ref 70–99)
Potassium: 3.9 mmol/L (ref 3.5–5.1)
Sodium: 140 mmol/L (ref 135–145)

## 2024-03-26 LAB — CBC
HCT: 28.3 % — ABNORMAL LOW (ref 36.0–46.0)
Hemoglobin: 8.2 g/dL — ABNORMAL LOW (ref 12.0–15.0)
MCH: 20.2 pg — ABNORMAL LOW (ref 26.0–34.0)
MCHC: 29 g/dL — ABNORMAL LOW (ref 30.0–36.0)
MCV: 69.7 fL — ABNORMAL LOW (ref 80.0–100.0)
Platelets: 420 10*3/uL — ABNORMAL HIGH (ref 150–400)
RBC: 4.06 MIL/uL (ref 3.87–5.11)
RDW: 17.6 % — ABNORMAL HIGH (ref 11.5–15.5)
WBC: 7.6 10*3/uL (ref 4.0–10.5)
nRBC: 0 % (ref 0.0–0.2)

## 2024-03-26 NOTE — Patient Instructions (Addendum)
 Your procedure is scheduled on:04-03-24 Tuesday Report to the Registration Desk on the 1st floor of the [facility].Then proceed to the 2nd floor Surgery Desk To find out your arrival time, please call [phone] between 1PM - 3PM on:04-02-24 Monday If your arrival time is 6:00 am, do not arrive before that time as the Medical Mall entrance doors do not open until 6:00 am.  REMEMBER: Instructions that are not followed completely may result in serious medical risk, up to and including death; or upon the discretion of your surgeon and anesthesiologist your surgery may need to be rescheduled.  Do not eat food after midnight the night before surgery.  No gum chewing or hard candies.  You may however, drink CLEAR liquids up to 2 hours before you are scheduled to arrive for your surgery. Do not drink anything within 2 hours of your scheduled arrival time.  Clear liquids include: - water  - apple juice without pulp - gatorade (not RED colors) - black coffee or tea (Do NOT add milk or creamers to the coffee or tea) Do NOT drink anything that is not on this list.  In addition, your doctor has ordered for you to drink the provided:  Ensure Pre-Surgery Clear Carbohydrate Drink  Drinking this carbohydrate drink up to two hours before surgery helps to reduce insulin resistance and improve patient outcomes. Please complete drinking 2 hours before scheduled arrival time.  One week prior to surgery:Stop NOW (03-26-24) Stop Anti-inflammatories (NSAIDS) such as Advil , Aleve, Ibuprofen , Motrin , Naproxen, Naprosyn and Aspirin based products such as Excedrin, Goody's Powder, BC Powder. Stop ANY OVER THE COUNTER supplements until after surgery ( Multivitamin)  You may however, continue to take Tylenol  if needed for pain up until the day of surgery  Stop SEMAGLUTIDE 7 days prior to surgery-Do NOT take again until AFTER surgery  Continue taking all of your other prescription medications up until the day of  surgery.  Do NOT take any medication the day of surgery  No Alcohol for 24 hours before or after surgery.  No Smoking including e-cigarettes for 24 hours before surgery.  No chewable tobacco products for at least 6 hours before surgery.  No nicotine patches on the day of surgery.  Do not use any "recreational" drugs for at least a week (preferably 2 weeks) before your surgery.  Please be advised that the combination of cocaine and anesthesia may have negative outcomes, up to and including death. If you test positive for cocaine, your surgery will be cancelled.  On the morning of surgery brush your teeth with toothpaste and water, you may rinse your mouth with mouthwash if you wish. Do not swallow any toothpaste or mouthwash.  Use CHG Soap directed on instruction sheet.  Do not wear jewelry, make-up, hairpins, clips or nail polish.  For welded (permanent) jewelry: bracelets, anklets, waist bands, etc.  Please have this removed prior to surgery.  If it is not removed, there is a chance that hospital personnel will need to cut it off on the day of surgery.  Do not wear lotions, powders, or perfumes.   Do not shave body hair from the neck down 48 hours before surgery.  Contact lenses, hearing aids and dentures may not be worn into surgery.  Do not bring valuables to the hospital. [facility] is not responsible for any missing/lost belongings or valuables.   Notify your doctor if there is any change in your medical condition (cold, fever, infection).  Wear comfortable clothing (specific  to your surgery type) to the hospital.  After surgery, you can help prevent lung complications by doing breathing exercises.  Take deep breaths and cough every 1-2 hours. Your doctor may order a device called an Incentive Spirometer to help you take deep breaths. When coughing or sneezing, hold a pillow firmly against your incision with both hands. This is called "splinting." Doing this helps protect  your incision. It also decreases belly discomfort.  If you are being admitted to the hospital overnight, leave your suitcase in the car. After surgery it may be brought to your room.  In case of increased patient census, it may be necessary for you, the patient, to continue your postoperative care in the Same Day Surgery department.  If you are being discharged the day of surgery, you will not be allowed to drive home. You will need a responsible individual to drive you home and stay with you for 24 hours after surgery.   If you are taking public transportation, you will need to have a responsible individual with you.  Please call the Pre-admissions Testing Dept. at [phone] if you have any questions about these instructions.  Surgery Visitation Policy:  Patients having surgery or a procedure may have two visitors.  Children under the age of 71 must have an adult with them who is not the patient.     Preparing for Surgery with CHLORHEXIDINE GLUCONATE (CHG) Soap  Chlorhexidine Gluconate (CHG) Soap  o An antiseptic cleaner that kills germs and bonds with the skin to continue killing germs even after washing  o Used for showering the night before surgery and morning of surgery  Before surgery, you can play an important role by reducing the number of germs on your skin.  CHG (Chlorhexidine gluconate) soap is an antiseptic cleanser which kills germs and bonds with the skin to continue killing germs even after washing.  Please do not use if you have an allergy to CHG or antibacterial soaps. If your skin becomes reddened/irritated stop using the CHG.  1. Shower the NIGHT BEFORE SURGERY and the MORNING OF SURGERY with CHG soap.  2. If you choose to wash your hair, wash your hair first as usual with your normal shampoo.  3. After shampooing, rinse your hair and body thoroughly to remove the shampoo.  4. Use CHG as you would any other liquid soap. You can apply CHG directly to the  skin and wash gently with a scrungie or a clean washcloth.  5. Apply the CHG soap to your body only from the neck down. Do not use on open wounds or open sores. Avoid contact with your eyes, ears, mouth, and genitals (private parts). Wash face and genitals (private parts) with your normal soap.  6. Wash thoroughly, paying special attention to the area where your surgery will be performed.  7. Thoroughly rinse your body with warm water.  8. Do not shower/wash with your normal soap after using and rinsing off the CHG soap.  9. Pat yourself dry with a clean towel.  10. Wear clean pajamas to bed the night before surgery.  12. Place clean sheets on your bed the night of your first shower and do not sleep with pets.  13. Shower again with the CHG soap on the day of surgery prior to arriving at the hospital.  14. Do not apply any deodorants/lotions/powders.  15. Please wear clean clothes to the hospital.  How to Use an Incentive Spirometer An incentive spirometer is a tool  that measures how well you are filling your lungs with each breath. Learning to take long, deep breaths using this tool can help you keep your lungs clear and active. This may help to reverse or lessen your chance of developing breathing (pulmonary) problems, especially infection. You may be asked to use a spirometer: After a surgery. If you have a lung problem or a history of smoking. After a long period of time when you have been unable to move or be active. If the spirometer includes an indicator to show the highest number that you have reached, your health care provider or respiratory therapist will help you set a goal. Keep a log of your progress as told by your health care provider. What are the risks? Breathing too quickly may cause dizziness or cause you to pass out. Take your time so you do not get dizzy or light-headed. If you are in pain, you may need to take pain medicine before doing incentive spirometry. It is  harder to take a deep breath if you are having pain. How to use your incentive spirometer  Sit up on the edge of your bed or on a chair. Hold the incentive spirometer so that it is in an upright position. Before you use the spirometer, breathe out normally. Place the mouthpiece in your mouth. Make sure your lips are closed tightly around it. Breathe in slowly and as deeply as you can through your mouth, causing the piston or the ball to rise toward the top of the chamber. Hold your breath for 3-5 seconds, or for as long as possible. If the spirometer includes a coach indicator, use this to guide you in breathing. Slow down your breathing if the indicator goes above the marked areas. Remove the mouthpiece from your mouth and breathe out normally. The piston or ball will return to the bottom of the chamber. Rest for a few seconds, then repeat the steps 10 or more times. Take your time and take a few normal breaths between deep breaths so that you do not get dizzy or light-headed. Do this every 1-2 hours when you are awake. If the spirometer includes a goal marker to show the highest number you have reached (best effort), use this as a goal to work toward during each repetition. After each set of 10 deep breaths, cough a few times. This will help to make sure that your lungs are clear. If you have an incision on your chest or abdomen from surgery, place a pillow or a rolled-up towel firmly against the incision when you cough. This can help to reduce pain while taking deep breaths and coughing. General tips When you are able to get out of bed: Walk around often. Continue to take deep breaths and cough in order to clear your lungs. Keep using the incentive spirometer until your health care provider says it is okay to stop using it. If you have been in the hospital, you may be told to keep using the spirometer at home. Contact a health care provider if: You are having difficulty using the  spirometer. You have trouble using the spirometer as often as instructed. Your pain medicine is not giving enough relief for you to use the spirometer as told. You have a fever. Get help right away if: You develop shortness of breath. You develop a cough with bloody mucus from the lungs. You have fluid or blood coming from an incision site after you cough. Summary An incentive spirometer is a tool  that can help you learn to take long, deep breaths to keep your lungs clear and active. You may be asked to use a spirometer after a surgery, if you have a lung problem or a history of smoking, or if you have been inactive for a long period of time. Use your incentive spirometer as instructed every 1-2 hours while you are awake. If you have an incision on your chest or abdomen, place a pillow or a rolled-up towel firmly against your incision when you cough. This will help to reduce pain. Get help right away if you have shortness of breath, you cough up bloody mucus, or blood comes from your incision when you cough. This information is not intended to replace advice given to you by your health care provider. Make sure you discuss any questions you have with your health care provider. Document Revised: 08/12/2023 Document Reviewed: 08/12/2023 Elsevier Patient Education  2024 [employer].

## 2024-04-03 ENCOUNTER — Encounter
Admission: RE | Disposition: A | Discharge status: Home / Self Care | Payer: Self-pay | Source: Home / Self Care | Attending: Orthopedic Surgery

## 2024-04-03 ENCOUNTER — Ambulatory Visit: Payer: Self-pay | Admitting: Urgent Care

## 2024-04-03 ENCOUNTER — Ambulatory Visit
Admission: RE | Admit: 2024-04-03 | Discharge: 2024-04-03 | Disposition: A | Discharge status: Home / Self Care | Attending: Orthopedic Surgery | Admitting: Orthopedic Surgery

## 2024-04-03 ENCOUNTER — Ambulatory Visit

## 2024-04-03 ENCOUNTER — Ambulatory Visit: Admitting: Anesthesiology

## 2024-04-03 ENCOUNTER — Encounter: Payer: Self-pay | Admitting: Orthopedic Surgery

## 2024-04-03 ENCOUNTER — Other Ambulatory Visit: Payer: Self-pay

## 2024-04-03 DIAGNOSIS — X58XXXA Exposure to other specified factors, initial encounter: Secondary | ICD-10-CM | POA: Insufficient documentation

## 2024-04-03 DIAGNOSIS — S73191A Other sprain of right hip, initial encounter: Secondary | ICD-10-CM | POA: Insufficient documentation

## 2024-04-03 DIAGNOSIS — M76891 Other specified enthesopathies of right lower limb, excluding foot: Secondary | ICD-10-CM | POA: Diagnosis present

## 2024-04-03 DIAGNOSIS — M25851 Other specified joint disorders, right hip: Secondary | ICD-10-CM | POA: Insufficient documentation

## 2024-04-03 DIAGNOSIS — Z6841 Body Mass Index (BMI) 40.0 and over, adult: Secondary | ICD-10-CM | POA: Diagnosis not present

## 2024-04-03 DIAGNOSIS — Z87891 Personal history of nicotine dependence: Secondary | ICD-10-CM | POA: Insufficient documentation

## 2024-04-03 DIAGNOSIS — M1611 Unilateral primary osteoarthritis, right hip: Secondary | ICD-10-CM | POA: Diagnosis not present

## 2024-04-03 DIAGNOSIS — E66813 Obesity, class 3: Secondary | ICD-10-CM | POA: Insufficient documentation

## 2024-04-03 DIAGNOSIS — K219 Gastro-esophageal reflux disease without esophagitis: Secondary | ICD-10-CM | POA: Diagnosis not present

## 2024-04-03 DIAGNOSIS — Z01812 Encounter for preprocedural laboratory examination: Secondary | ICD-10-CM

## 2024-04-03 HISTORY — PX: HIP ARTHROSCOPY: SHX668

## 2024-04-03 LAB — TYPE AND SCREEN
ABO/RH(D): A POS
Antibody Screen: NEGATIVE

## 2024-04-03 LAB — POCT PREGNANCY, URINE: Preg Test, Ur: NEGATIVE

## 2024-04-03 SURGERY — ARTHROSCOPY HIP
Anesthesia: General | Site: Hip | Laterality: Right

## 2024-04-03 MED ORDER — ASPIRIN 325 MG PO TBEC: 325.0000 mg | DELAYED_RELEASE_TABLET | Freq: Every day | ORAL | 0 refills | Status: AC

## 2024-04-03 MED ORDER — ACETAMINOPHEN 10 MG/ML IV SOLN
INTRAVENOUS | Status: DC | PRN
  Administered 2024-04-03: 1000 mg via INTRAVENOUS

## 2024-04-03 MED ORDER — OXYCODONE HCL 5 MG PO TABS
ORAL_TABLET | ORAL | Status: AC
  Filled 2024-04-03: qty 2

## 2024-04-03 MED ORDER — ACETAMINOPHEN 500 MG PO TABS: 1000.0000 mg | ORAL_TABLET | Freq: Three times a day (TID) | ORAL | 2 refills | Status: DC

## 2024-04-03 MED ORDER — LACTATED RINGERS IR SOLN
Status: DC | PRN
  Administered 2024-04-03 (×22): 3000 mL

## 2024-04-03 MED ORDER — PROPOFOL 10 MG/ML IV BOLUS
INTRAVENOUS | Status: DC | PRN
  Administered 2024-04-03: 200 mg via INTRAVENOUS
  Administered 2024-04-03: 20 mg via INTRAVENOUS

## 2024-04-03 MED ORDER — LACTATED RINGERS IV SOLN: INTRAVENOUS | Status: DC | PRN

## 2024-04-03 MED ORDER — DOCUSATE SODIUM 100 MG PO CAPS: 100.0000 mg | ORAL_CAPSULE | Freq: Every day | ORAL | 2 refills | Status: DC | PRN

## 2024-04-03 MED ORDER — ONDANSETRON HCL 4 MG/2ML IJ SOLN
INTRAMUSCULAR | Status: DC | PRN
Start: 2024-04-03 — End: 2024-04-03
  Administered 2024-04-03: 4 mg via INTRAVENOUS

## 2024-04-03 MED ORDER — KETAMINE HCL 50 MG/5ML IJ SOSY
PREFILLED_SYRINGE | INTRAMUSCULAR | Status: AC
  Filled 2024-04-03: qty 5

## 2024-04-03 MED ORDER — CEFAZOLIN SODIUM-DEXTROSE 3-4 GM/150ML-% IV SOLN
3.0000 g | INTRAVENOUS | Status: DC
  Filled 2024-04-03: qty 150

## 2024-04-03 MED ORDER — DEXAMETHASONE SODIUM PHOSPHATE 10 MG/ML IJ SOLN
INTRAMUSCULAR | Status: AC
  Filled 2024-04-03: qty 1

## 2024-04-03 MED ORDER — MIDAZOLAM HCL 2 MG/2ML IJ SOLN
INTRAMUSCULAR | Status: DC | PRN
  Administered 2024-04-03: 2 mg via INTRAVENOUS

## 2024-04-03 MED ORDER — SUGAMMADEX SODIUM 200 MG/2ML IV SOLN
INTRAVENOUS | Status: DC | PRN
  Administered 2024-04-03: 100 mg via INTRAVENOUS
  Administered 2024-04-03: 200 mg via INTRAVENOUS

## 2024-04-03 MED ORDER — BUPIVACAINE LIPOSOME 1.3 % IJ SUSP
INTRAMUSCULAR | Status: AC
  Filled 2024-04-03: qty 20

## 2024-04-03 MED ORDER — OXYCODONE HCL 5 MG PO TABS: 5.0000 mg | ORAL_TABLET | ORAL | 0 refills | Status: DC | PRN

## 2024-04-03 MED ORDER — LACTATED RINGERS IV SOLN
INTRAVENOUS | Status: DC | PRN
  Administered 2024-04-03 (×4): 3001 mL

## 2024-04-03 MED ORDER — DROPERIDOL 2.5 MG/ML IJ SOLN: 0.6250 mg | Freq: Once | INTRAMUSCULAR | Status: DC | PRN

## 2024-04-03 MED ORDER — OXYCODONE HCL 5 MG PO TABS
10.0000 mg | ORAL_TABLET | Freq: Once | ORAL | Status: AC
  Administered 2024-04-03: 10 mg via ORAL

## 2024-04-03 MED ORDER — CEFAZOLIN SODIUM-DEXTROSE 3-4 GM/150ML-% IV SOLN
3.0000 g | Freq: Once | INTRAVENOUS | Status: AC
  Administered 2024-04-03: 3 g via INTRAVENOUS
  Filled 2024-04-03: qty 150

## 2024-04-03 MED ORDER — TIZANIDINE HCL 4 MG PO TABS: 4.0000 mg | ORAL_TABLET | Freq: Three times a day (TID) | ORAL | 0 refills | Status: DC

## 2024-04-03 MED ORDER — KETAMINE HCL 10 MG/ML IJ SOLN
INTRAMUSCULAR | Status: DC | PRN
  Administered 2024-04-03: 10 mg via INTRAVENOUS
  Administered 2024-04-03: 20 mg via INTRAVENOUS

## 2024-04-03 MED ORDER — ALBUMIN HUMAN 5 % IV SOLN
INTRAVENOUS | Status: AC
  Filled 2024-04-03: qty 500

## 2024-04-03 MED ORDER — PHENYLEPHRINE 80 MCG/ML (10ML) SYRINGE FOR IV PUSH (FOR BLOOD PRESSURE SUPPORT)
PREFILLED_SYRINGE | INTRAVENOUS | Status: DC | PRN
  Administered 2024-04-03 (×7): 80 ug via INTRAVENOUS

## 2024-04-03 MED ORDER — PROPOFOL 10 MG/ML IV BOLUS
INTRAVENOUS | Status: AC
  Filled 2024-04-03: qty 20

## 2024-04-03 MED ORDER — ONDANSETRON 4 MG PO TBDP: 4.0000 mg | ORAL_TABLET | Freq: Three times a day (TID) | ORAL | 0 refills | Status: DC | PRN

## 2024-04-03 MED ORDER — FENTANYL CITRATE (PF) 100 MCG/2ML IJ SOLN
INTRAMUSCULAR | Status: AC
  Filled 2024-04-03: qty 2

## 2024-04-03 MED ORDER — PROPOFOL 1000 MG/100ML IV EMUL
INTRAVENOUS | Status: AC
  Filled 2024-04-03: qty 100

## 2024-04-03 MED ORDER — PROPOFOL 500 MG/50ML IV EMUL
INTRAVENOUS | Status: DC | PRN
Start: 2024-04-03 — End: 2024-04-03
  Administered 2024-04-03: 20 ug/kg/min via INTRAVENOUS

## 2024-04-03 MED ORDER — FENTANYL CITRATE (PF) 100 MCG/2ML IJ SOLN
25.0000 ug | INTRAMUSCULAR | Status: DC | PRN
  Administered 2024-04-03 (×2): 50 ug via INTRAVENOUS

## 2024-04-03 MED ORDER — SUCCINYLCHOLINE CHLORIDE 200 MG/10ML IV SOSY
PREFILLED_SYRINGE | INTRAVENOUS | Status: DC | PRN
  Administered 2024-04-03: 140 mg via INTRAVENOUS

## 2024-04-03 MED ORDER — OMEPRAZOLE MAGNESIUM 20 MG PO TBEC: 20.0000 mg | DELAYED_RELEASE_TABLET | Freq: Every day | ORAL | 0 refills | Status: AC

## 2024-04-03 MED ORDER — ROCURONIUM BROMIDE 100 MG/10ML IV SOLN
INTRAVENOUS | Status: DC | PRN
  Administered 2024-04-03: 30 mg via INTRAVENOUS
  Administered 2024-04-03: 50 mg via INTRAVENOUS
  Administered 2024-04-03 (×2): 20 mg via INTRAVENOUS
  Administered 2024-04-03: 30 mg via INTRAVENOUS
  Administered 2024-04-03: 50 mg via INTRAVENOUS

## 2024-04-03 MED ORDER — SCOPOLAMINE 1 MG/3DAYS TD PT72
MEDICATED_PATCH | TRANSDERMAL | Status: AC
  Filled 2024-04-03: qty 1

## 2024-04-03 MED ORDER — ONDANSETRON HCL 4 MG/2ML IJ SOLN
INTRAMUSCULAR | Status: AC
  Filled 2024-04-03: qty 2

## 2024-04-03 MED ORDER — FENTANYL CITRATE (PF) 100 MCG/2ML IJ SOLN
INTRAMUSCULAR | Status: DC | PRN
  Administered 2024-04-03 (×2): 25 ug via INTRAVENOUS
  Administered 2024-04-03: 50 ug via INTRAVENOUS
  Administered 2024-04-03 (×4): 25 ug via INTRAVENOUS

## 2024-04-03 MED ORDER — ROCURONIUM BROMIDE 10 MG/ML (PF) SYRINGE
PREFILLED_SYRINGE | INTRAVENOUS | Status: AC
Start: 2024-04-03 — End: 2024-04-03
  Filled 2024-04-03: qty 10

## 2024-04-03 MED ORDER — BUPIVACAINE LIPOSOME 1.3 % IJ SUSP
INTRAMUSCULAR | Status: DC | PRN
  Administered 2024-04-03: 40 mL

## 2024-04-03 MED ORDER — CHLORHEXIDINE GLUCONATE 0.12 % MT SOLN
OROMUCOSAL | Status: AC
  Filled 2024-04-03: qty 15

## 2024-04-03 MED ORDER — CEFAZOLIN SODIUM-DEXTROSE 3-4 GM/150ML-% IV SOLN
3.0000 g | INTRAVENOUS | Status: AC
  Administered 2024-04-03: 3 g via INTRAVENOUS
  Filled 2024-04-03: qty 150

## 2024-04-03 MED ORDER — EPINEPHRINE PF 1 MG/ML IJ SOLN
INTRAMUSCULAR | Status: AC
Start: 2024-04-03 — End: 2024-04-03
  Filled 2024-04-03: qty 4

## 2024-04-03 MED ORDER — MIDAZOLAM HCL 2 MG/2ML IJ SOLN
INTRAMUSCULAR | Status: AC
Start: 2024-04-03 — End: 2024-04-03
  Filled 2024-04-03: qty 2

## 2024-04-03 MED ORDER — FENTANYL CITRATE (PF) 100 MCG/2ML IJ SOLN
INTRAMUSCULAR | Status: AC
Start: 2024-04-03 — End: 2024-04-03
  Filled 2024-04-03: qty 2

## 2024-04-03 MED ORDER — SUCCINYLCHOLINE CHLORIDE 200 MG/10ML IV SOSY
PREFILLED_SYRINGE | INTRAVENOUS | Status: AC
  Filled 2024-04-03: qty 10

## 2024-04-03 MED ORDER — LIDOCAINE HCL (CARDIAC) PF 100 MG/5ML IV SOSY
PREFILLED_SYRINGE | INTRAVENOUS | Status: DC | PRN
  Administered 2024-04-03: 100 mg via INTRAVENOUS

## 2024-04-03 MED ORDER — DEXAMETHASONE SODIUM PHOSPHATE 10 MG/ML IJ SOLN
INTRAMUSCULAR | Status: DC | PRN
  Administered 2024-04-03: 10 mg via INTRAVENOUS

## 2024-04-03 MED ORDER — LIDOCAINE HCL (PF) 2 % IJ SOLN
INTRAMUSCULAR | Status: AC
Start: 2024-04-03 — End: 2024-04-03
  Filled 2024-04-03: qty 5

## 2024-04-03 MED ORDER — ORAL CARE MOUTH RINSE
15.0000 mL | Freq: Once | OROMUCOSAL | Status: AC
Start: 2024-04-03 — End: 2024-04-03

## 2024-04-03 MED ORDER — SCOPOLAMINE 1 MG/3DAYS TD PT72
1.0000 | MEDICATED_PATCH | Freq: Once | TRANSDERMAL | Status: DC
  Administered 2024-04-03: 1.5 mg via TRANSDERMAL

## 2024-04-03 MED ORDER — PHENYLEPHRINE 80 MCG/ML (10ML) SYRINGE FOR IV PUSH (FOR BLOOD PRESSURE SUPPORT)
PREFILLED_SYRINGE | INTRAVENOUS | Status: AC
  Filled 2024-04-03: qty 10

## 2024-04-03 MED ORDER — BUPIVACAINE HCL (PF) 0.5 % IJ SOLN
INTRAMUSCULAR | Status: AC
  Filled 2024-04-03: qty 30

## 2024-04-03 MED ORDER — CHLORHEXIDINE GLUCONATE 0.12 % MT SOLN
15.0000 mL | Freq: Once | OROMUCOSAL | Status: AC
Start: 2024-04-03 — End: 2024-04-03
  Administered 2024-04-03: 15 mL via OROMUCOSAL

## 2024-04-03 MED ORDER — ACETAMINOPHEN 10 MG/ML IV SOLN
INTRAVENOUS | Status: AC
  Filled 2024-04-03: qty 100

## 2024-04-03 MED ORDER — LACTATED RINGERS IV SOLN: INTRAVENOUS | Status: DC

## 2024-04-03 MED ORDER — NAPROXEN 500 MG PO TABS: 500.0000 mg | ORAL_TABLET | Freq: Two times a day (BID) | ORAL | 0 refills | Status: AC

## 2024-04-03 MED ORDER — PHENYLEPHRINE HCL-NACL 20-0.9 MG/250ML-% IV SOLN
INTRAVENOUS | Status: AC
Start: 2024-04-03 — End: 2024-04-03
  Filled 2024-04-03: qty 250

## 2024-04-03 MED ORDER — ROCURONIUM BROMIDE 10 MG/ML (PF) SYRINGE
PREFILLED_SYRINGE | INTRAVENOUS | Status: AC
  Filled 2024-04-03: qty 10

## 2024-04-03 SURGICAL SUPPLY — 60 items
ADAPTER IRRIG TUBE 2 SPIKE SOL (ADAPTER) ×2 IMPLANT
ANCHOR SUT 1.4 FLEX (Anchor) IMPLANT
ANCHOR SUT 2.4 SS KNTLS #1 (Anchor) IMPLANT
BIT DRILL FLEX NANOTACK (BIT) IMPLANT
BIT DRILL SS CINCHLOCK (BIT) ×1 IMPLANT
BLADE SAMURAI STR FULL RADIUS (BLADE) ×1 IMPLANT
BLADE SURG SZ11 CARB STEEL (BLADE) ×1 IMPLANT
BNDG ADH 1X3 SHEER STRL LF (GAUZE/BANDAGES/DRESSINGS) ×6 IMPLANT
BUR 4.0 ROUND XL DIAMOND (BUR) ×1 IMPLANT
BUR 5.5 ROUND LONG FS 8 FLUTE (BUR) ×1 IMPLANT
CANNULA 8 789 TRANSPORT (CANNULA) ×1 IMPLANT
CANNULA OBTURATOR FLOWPORT (CANNULA) ×1 IMPLANT
CHLORAPREP W/TINT 26 (MISCELLANEOUS) ×1 IMPLANT
COOLER POLAR GLACIER W/PUMP (MISCELLANEOUS) ×1 IMPLANT
COVER LIGHT HANDLE STERIS (MISCELLANEOUS) ×2 IMPLANT
CUTTER AGGRESSIVE PLUS 4D 180L (CUTTER) ×1 IMPLANT
DEVICE SUCT BLK HOLE OR FLOOR (MISCELLANEOUS) ×1 IMPLANT
DRAPE ARTHROSCOPY W/POUCH 90 (DRAPES) ×1 IMPLANT
DRAPE C-ARM 42X72 X-RAY (DRAPES) ×1 IMPLANT
DRAPE SHEET LG 3/4 BI-LAMINATE (DRAPES) IMPLANT
DRAPE SURG 17X11 SM STRL (DRAPES) ×1 IMPLANT
GAUZE SPONGE 4X4 12PLY STRL (GAUZE/BANDAGES/DRESSINGS) ×1 IMPLANT
GLOVE BIO SURGEON STRL SZ7.5 (GLOVE) ×2 IMPLANT
GLOVE BIOGEL PI IND STRL 8 (GLOVE) ×1 IMPLANT
GLOVE INDICATOR 8.0 STRL GRN (GLOVE) ×1 IMPLANT
GLOVE SURG ORTHO 8.0 STRL STRW (GLOVE) ×2 IMPLANT
GOWN SRG LRG LVL 4 IMPRV REINF (GOWNS) ×1 IMPLANT
GOWN SRG XL LONG LVL 3 NONREIN (GOWNS) ×1 IMPLANT
IV LR IRRIG 3000ML ARTHROMATIC (IV SOLUTION) ×6 IMPLANT
KIT PATIENT POSITION MEDIUM (KITS) IMPLANT
KIT PORTAL ENTRY HIP ACCESS (KITS) ×1 IMPLANT
KIT TURNOVER KIT A (KITS) ×1 IMPLANT
MANIFOLD NEPTUNE II (INSTRUMENTS) ×2 IMPLANT
MAT ABSORB FLUID 56X50 GRAY (MISCELLANEOUS) ×1 IMPLANT
NDL INJECTOR II CARTRIDGE (MISCELLANEOUS) ×1 IMPLANT
NDL SAFETY ECLIPSE 18X1.5 (NEEDLE) ×1 IMPLANT
NEEDLE INJECTOR II CARTRIDGE (MISCELLANEOUS) ×1 IMPLANT
PACK ARTHROSCOPY KNEE (MISCELLANEOUS) ×2 IMPLANT
PAD ABD DERMACEA PRESS 5X9 (GAUZE/BANDAGES/DRESSINGS) ×1 IMPLANT
PAD ARMBOARD POSITIONER FOAM (MISCELLANEOUS) ×1 IMPLANT
PAD WRAPON POLOR MULTI XL (MISCELLANEOUS) ×1 IMPLANT
PASSER SUT 1.5D CRESCENT (INSTRUMENTS) ×1 IMPLANT
PASSER SUT 70D UP ANGLED (INSTRUMENTS) ×1 IMPLANT
SPONGE T-LAP 18X18 ~~LOC~~+RFID (SPONGE) ×1 IMPLANT
SUT VIC AB 2-0 CT2 27 (SUTURE) ×1 IMPLANT
SUT XBRAID 1.4 BLK/WHT (SUTURE) IMPLANT
SUT XBRAID 1.4 BLUE (SUTURE) IMPLANT
SUT XBRAID 1.4 WHITE/BLUE (SUTURE) IMPLANT
SUT XBRAID 2 BLACK/BLUE (SUTURE) IMPLANT
SUT ZIPLINE SZ2 BLK (SUTURE) ×3 IMPLANT
SUT ZIPLINE SZ2 GREEN (SUTURE) ×4 IMPLANT
SUTURE EHLN 3-0 FS-10 30 BLK (SUTURE) ×1 IMPLANT
SUTURE FORCE FIBER 2 38IN K BL (SUTURE) IMPLANT
SUTURE TAPE XBRAID 1.2 BLUE 45 (SUTURE) IMPLANT
TRAP FLUID SMOKE EVACUATOR (MISCELLANEOUS) ×1 IMPLANT
TRAY FOLEY SLVR 16FR LF STAT (SET/KITS/TRAYS/PACK) ×1 IMPLANT
TUBE SET DOUBLEFLO INFLOW (TUBING) ×1 IMPLANT
TUBE SET DOUBLEFLO OUTFLOW (TUBING) ×1 IMPLANT
WAND SERFAS 50-S SWEEP XL (INSTRUMENTS) ×1 IMPLANT
WATER STERILE IRR 500ML POUR (IV SOLUTION) ×1 IMPLANT

## 2024-04-03 NOTE — H&P (Signed)
 Paper H&P to be scanned into permanent record. H&P reviewed. No significant changes noted.

## 2024-04-03 NOTE — TOC Progression Note (Signed)
 Transition of Care [facility]) - Progression Note    Patient Details  Name: Amy Maxwell MRN: [id number] Date of Birth: May 10, 1981  [facility] CM/SW Contact  Alexandra Ice, RN Phone Number: 04/03/2024, 12:03 PM  Clinical Narrative:        Received message from bedside nurse patient needs RW and to discharge today. Sent order to Jon with [employer] for processing.    Expected Discharge Plan and Services                                               Social Determinants of Health (SDOH) Interventions SDOH Screenings   Food Insecurity: No Food Insecurity (02/01/2024)   Received from [facility]  Housing: Low Risk  (02/01/2024)   Received from [facility]  Transportation Needs: No Transportation Needs (02/01/2024)   Received from [facility]  Utilities: Not At Risk (02/01/2024)   Received from [facility]  Financial Resource Strain: Low Risk  (02/01/2024)   Received from [facility]  Tobacco Use: Medium Risk (04/03/2024)    Readmission Risk Interventions     No data to display

## 2024-04-03 NOTE — Op Note (Addendum)
 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 Right femoroacetabular impingement 2. Right hip labral tear   PROCEDURES:  1. Right hip arthroscopy with acetabuloplasty, labral repair, femoral osteochondroplasty, and capsular closure   SURGEON: Cleotilde Dago, MD  ASSISTANT: Bert Britain, PA   ANESTHESIA: Gen   ESTIMATED BLOOD LOSS: minimal   TOTAL IV FLUIDS: per anesthesia  INDICATION(S): The patient is a 43 y.o. year old female who presents with persistent hip pain.  Radiographs demonstrated FAI morphology and the MRI revealed a labral tear.  She has failed greater than 3 months of non-operative treatment to date including activity modifications, physical therapy, and corticosteroid injection.  Please see the preoperative notes for further detail.   She elected to undergo the above mentioned procedure after detailed explanation of the expected outcomes and recovery path.   Informed consent was obtained outlining the expected benefits and possible risks of the surgery including a less than 5% chance of numbness in the sciatic or pudendal nerve regions, 20% chance of injury to the lateral femoral cutaneous nerve (1% permanent injury). 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  OPERATIVE FINDINGS: Cartilage Grade 2 degenerative changes of the acetabular cartilage along the labral tear extending 3mm into the joint at the apex High grade cartilage lesion: no Delamination: no Bone exposed: no Bruising: no Localization of femoral head high grade lesion: none  Cotyloid fossa osteophytes:  none The remainder of the visualized femoral and acetabular cartilage was normal.  Labrum Labral degeneration, yellow over 50% of labrum: no Complexity of tearing:  Tear at chondrolabral  junction Hypoplastic labrum: no Hyperplastic labrum:  no Lipstick sign at the psoas prominence: none Psoas Prominence: no  Boundaries of labral tear Convention (3 o'clock anterior, 9 o'clock posterior) Anterior boundary: 11 o'clock Posterior boundary: 3 o'clock  Ligamentum teres Hypertrophy:  no Tear: no   OPERATIVE REPORT:  The patient was brought to the operating room, placed supine on the operating table, and bony prominences were padded.  The traction boots were applied with padding to ensure that safe traction could be applied through the feet.  The contralateral limb was abducted slightly and light traction was applied.  The operative leg was brought into neutral position.  Appropriate preoperative IV antibiotics were administered. The patient was prepped and draped in a sterile fashion.  Time-out was performed and landmarks were identified. An air arthrogram was obtained by injecting 30cc into the hip joint while traction was pulled. This broke the labral seal allowing for distraction of the hip. Care was taken to ensure the least amount of force necessary to allow safe access to the joint of 8-83mm.  This was checked with fluoroscopy.   Next we placed an anterolateral portal under the assistance of fluoroscopy.  First, fluoroscopy was used to estimate the trajectory and starting point.  A 5mm incision with a #11 blade was made and a straight hemostat was used to dilate the portal through the appropriate tract.  We then placed a 14-gauge hypodermic needle with careful technique to be as close to the femoral head as possible and parallel to the sourcil to ensure no iatrogenic damage to the labrum.  This released the negative pressure environment and the amount of traction was adjusted to maintain the 8-61mm of distraction.  A nitinol  wire was placed through the needle and flouroscopy was used to ensure it extended to the medial wall of the acetabulum.  The Flowport from [employer]  was placed over the wire and the nitinol wire was retracted to just inside the capsule during insertion of the dilator and cannula to minimize the risk of breakage. The arthroscope was placed next and we visualized the anterior triangle.  We then placed the anterior portal under direct visualization using the technique described above.  This was safely placed as well without damage to the labrum or femoral head.  We then switched our arthroscope to the anterior portal to ensure we were not through the labrum - we were safely through the capsule only.  We then proceeded with a transverse capsulotomy connecting the 2 portals in the same plane utilizing the Samurai blade from [employer].  The Injector device from [employer] was used to place traction stitches each in the medial and lateral arms of the proximal capsule.  A Kelly clamp was used to hold the suture against the skin to apply traction. This allowed access to the acetabular rim and labrum as well as protection of the native edges of the capsule.  We identified the anterior inferior iliac spine proximally, the psoas tendon medially and the rectus tendon laterally as landmarks.  We then proceeded with a diagnostic arthroscopy - the results can be found in the findings section above.   We then used the 50 degree hip specific radiofrequency device and a 4mm shaver to clear the superior acetabulum and expose the subspinous region.  Next we exposed the acetabular rim leaving the chondrolabral junction intact.  There was significant anterior acetabulum overcoverage, consistent with preoperative plan. Working from both portals, the acetabular rim/subspinous region was reshaped with a 4.43mm diamond burr consistent with the preoperative three-dimensional imaging.  This was a primarily pincer driven impingement.  When adequate reshaping was confirmed with fluoroscopy, we then proceeded with the labral repair.  A distal anterolateral portal was placed under  direct visualization and the Transport cannula was inserted.  Care was taken to ensure the cannula was in the intermuscular plane between the gluteus minimus and iliocapsularis.  This portal was approximately 5cm distal and 1cm anterior to the anterolateral portal.      We placed 5 anchors at the 11 o'clock, 12 o'clock, 1 o'clock, 2 o'clock and 3 o'clock positions with a circumferential stitch. The sutures were passed using the crescent Nanopass from [employer].  The 4 anchors from 11-2 o'clock were placed using a 6 lock anchor.  A Nanotak anchor was used for the 3 o'clock position due to a thin acetabular rim.  This resulted in anatomic labral repair.  We debrided the loose cartilage at the rim and residual degenerative labral tissue.  Traction was let down with total traction time of 125 minutes.    We then turned our attention to the peripheral compartment.  We flexed the hip to 45 degrees. We viewed from the anterolateral portal and worked from the distal anterolateral portal.  First, we passed two traction stitches in the medial and lateral sides of the capsulotomy.  Traction stitches were passed through the distal capsule, 1 medially and one laterally.  This allowed for excellent view of the entire CAM lesion. The capsule was retracted with a switching stick through the AL portal when necessary.  We were able to view the medial and lateral synovial folds, identifying the medial and lateral circumflex arteries.  These were protected during the osteoplasty.  A 5.18mm burr was used to reshape the femoral neck.  The initial line of resection was defined with the use of dynamic exam and fluoroscopy.  The line was parallel to the acetabular rim with the leg in neutral rotation.  We viewed the base of the femoral neck to provide a foundation for the shape and size of the osteochondroplasty. We were able to adequately remove the cam lesion and reshape the femoral neck.  This was confirmed with fluoroscopy.   Dynamic exam showed no residual impingement flexed to 90 degrees with maximal internal rotation and external rotation.  Finally, we performed a complete capsular closure with Zipline suture from [employer], utilizing the Slingshot from [employer] to pass the suture.  3 simple stitches were placed across the interportal capsulotomy. These were then tied sequentially with alternating half hitches through an 8.73mm Transport cannula. Reapproximation of the capsule was confirmed.   We then removed the arthroscope and closed the incisions with 2-0 Vicryl subdermally and 3-0 nylon stitches. Local anesthetic was injected about the portals and tracts down to the joint. A sterile dressing was applied and hip brace was applied.  The patient was awakened from anesthesia and transferred to PACU in stable condition.  Of note, all extremities and available joints were mobilized approximately every 1-2 hours during this surgery to avoid complications from prolonged immobilization.   Of note, assistance from a PA was essential to performing the surgery. PA assisted with patient positioning, retraction, and instrumentation. The surgery would have been more difficult and had longer operative time without PA assistance.   POSTOPERATIVE PLAN: The patient will be discharged home from PACU.  Flatfoot weightbearing with hip abduction brace x3 weeks.  Patient will follow-up as an outpatient in 2 weeks.  Outpatient physical therapy to start in 3-4 days.  ASA 325 mg daily x 4 weeks for DVT prophylaxis.

## 2024-04-03 NOTE — Transfer of Care (Signed)
 Immediate Anesthesia Transfer of Care Note  Patient: Amy Maxwell  Procedure(s) Performed: ARTHROSCOPY HIP (Right: Hip)  Patient Location: PACU  Anesthesia Type:General  Level of Consciousness: awake and patient cooperative  Airway & Oxygen Therapy: Patient Spontanous Breathing and Patient connected to face mask oxygen  Post-op Assessment: Report given to RN and Post -op Vital signs reviewed and stable  Post vital signs: Reviewed and stable  Last Vitals:  Vitals Value Taken Time  BP 106/76 04/03/24 11:46  Temp    Pulse 99 04/03/24 11:50  Resp 17 04/03/24 11:50  SpO2 100 % 04/03/24 11:50  Vitals shown include unfiled device data.  Last Pain:  Vitals:   04/03/24 0624  TempSrc: Oral  PainSc: 2          Complications: No notable events documented.

## 2024-04-03 NOTE — Evaluation (Signed)
 Physical Therapy Evaluation Patient Details Name: Amy Maxwell MRN: [id number] DOB: 1981/05/13 Today's Date: 04/03/2024  History of Present Illness  Cruzita Lipa is a 43yoF with persistent hip pain, MRI revealed a labral tear. S/p Right hip arthroscopic repair on 04/03/24, with orders for FFWB, TDWB.  Clinical Impression  Pt educated on precautions, weight bearing, use of brace. Pt tries AMB with RW, BRW, and AC. Pt able to AMB with AC to her satisfaction- standard RW was physically abutting the ABDCT brace. Family at bedside for education as well, time taken to answer al questions. Pt ready for DC from a PT standpoint now, RN made aware. PT signing off.       If plan is discharge home, recommend the following:     Can travel by private vehicle        Equipment Recommendations Crutches  Recommendations for Other Services       Functional Status Assessment       Precautions / Restrictions        Mobility  Bed Mobility                    Transfers Overall transfer level: Needs assistance Equipment used: Rolling walker (2 wheels), Crutches Transfers: Sit to/from Stand Sit to Stand: Supervision                Ambulation/Gait   Gait Distance (Feet): 100 Feet (then 29ft with AC) Assistive device: Rolling walker (2 wheels)         General Gait Details: educated on correct gait seuqence and precauations  Stairs            Wheelchair Mobility     Tilt Bed    Modified Rankin (Stroke Patients Only)       Balance                                             Pertinent Vitals/Pain Pain Assessment Pain Assessment: 0-10 Pain Score: 9  Pain Location: operative hip Pain Intervention(s): Limited activity within patient's tolerance, Monitored during session, Premedicated before session    Home Living Family/patient expects to be discharged to:: Private residence Living Arrangements: Spouse/significant  other;Children Available Help at Discharge: Family;Available 24 hours/day Type of Home: House Home Access: Stairs to enter   [employer] of Steps: 1   Home Layout: Two level;Able to live on main level with bedroom/bathroom Home Equipment: None      Prior Function Prior Level of Function : Independent/Modified Independent;Driving                     Extremity/Trunk Assessment   Upper Extremity Assessment Upper Extremity Assessment: Overall WFL for tasks assessed    Lower Extremity Assessment Lower Extremity Assessment: Overall WFL for tasks assessed       Communication   Communication Communication: No apparent difficulties    Cognition                                         Cueing       General Comments      Exercises     Assessment/Plan    PT Assessment Patient does not need any further PT services  PT Problem List  PT Treatment Interventions      PT Goals (Current goals can be found in the Care Plan section)  Acute Rehab PT Goals PT Goal Formulation: All assessment and education complete, DC therapy    Frequency       Co-evaluation               AM-PAC PT 6 Clicks Mobility  Outcome Measure Help needed turning from your back to your side while in a flat bed without using bedrails?: None Help needed moving from lying on your back to sitting on the side of a flat bed without using bedrails?: None Help needed moving to and from a bed to a chair (including a wheelchair)?: None Help needed standing up from a chair using your arms (e.g., wheelchair or bedside chair)?: None Help needed to walk in hospital room?: A Little Help needed climbing 3-5 steps with a railing? : A Little 6 Click Score: 22    End of Session   Activity Tolerance: Patient tolerated treatment well;No increased pain Patient left: in bed;with family/visitor present;with call bell/phone within reach Nurse Communication: Mobility  status PT Visit Diagnosis: Other abnormalities of gait and mobility (R26.89)    Time: 9147-8295 PT Time Calculation (min) (ACUTE ONLY): 34 min   Charges:   PT Evaluation $PT Eval Moderate Complexity: 1 Mod PT Treatments $Self Care/Home Management: 8-22 PT General Charges $$ ACUTE PT VISIT: 1 Visit    3:59 PM, 04/03/24 Dawn Eth, PT, DPT Physical Therapist - [facility]  [phone] (ASCOM)    Tiasha Helvie C 04/03/2024, 3:58 PM

## 2024-04-03 NOTE — Anesthesia Procedure Notes (Signed)
 Procedure Name: Intubation Date/Time: 04/03/2024 7:47 AM  Performed by: Philippe Brazen, CRNAPre-anesthesia Checklist: Patient identified, Emergency Drugs available, Suction available and Patient being monitored Patient Re-evaluated:Patient Re-evaluated prior to induction Oxygen Delivery Method: Circle system utilized Preoxygenation: Pre-oxygenation with 100% oxygen Induction Type: IV induction Ventilation: Mask ventilation without difficulty Laryngoscope Size: McGrath and 3 Grade View: Grade I Tube type: Oral Tube size: 7.0 mm Number of attempts: 1 Airway Equipment and Method: Stylet and Oral airway Placement Confirmation: ETT inserted through vocal cords under direct vision, positive ETCO2 and breath sounds checked- equal and bilateral Secured at: 21 cm Tube secured with: Tape Dental Injury: Teeth and Oropharynx as per pre-operative assessment

## 2024-04-03 NOTE — Evaluation (Signed)
 Occupational Therapy Evaluation Patient Details Name: Amy Maxwell MRN: [id number] DOB: Nov 22, 1980 Today's Date: 04/03/2024   History of Present Illness   Amy Maxwell is a 43yoF with persistent hip pain, MRI revealed a labral tear. S/p Right hip arthroscopic repair on 04/03/24, with orders for FFWB, TDWB.    Clinical Impressions Amy Maxwell was seen for OT evaluation this date. Prior to hospital admission, pt was IND. Pt lives with spouse in 2 level home, 1 STE. Pt currently requires MAX A don/doff underwear and brace in standing. CGA + BRW for toielt t/f, IND for pericare. Educate don hip abduction pcns, falls prevention, polar care, and DME recs. All education complete, no OT needs identified, will sign off. Upon hospital discharge, recommend no OT follow up.     If plan is discharge home, recommend the following:   A little help with walking and/or transfers;A little help with bathing/dressing/bathroom;Help with stairs or ramp for entrance     Functional Status Assessment   Patient has had a recent decline in their functional status and demonstrates the ability to make significant improvements in function in a reasonable and predictable amount of time.     Equipment Recommendations   Other (comment) (BRW)     Recommendations for Other Services         Precautions/Restrictions   Precautions Precautions: Fall (hip abduction) Recall of Precautions/Restrictions: Intact Required Braces or Orthoses: Other Brace Other Brace: hip abduction brace Restrictions Weight Bearing Restrictions Per Provider Order: Yes RLE Weight Bearing Per Provider Order:  (FFWB)     Mobility Bed Mobility Overal bed mobility: Needs Assistance Bed Mobility: Supine to Sit     Supine to sit: Min assist     General bed mobility comments: assist for RLE mgmt    Transfers Overall transfer level: Needs assistance Equipment used: Rolling walker (2 wheels) Transfers: Sit to/from  Stand Sit to Stand: Contact guard assist                  Balance Overall balance assessment: Needs assistance Sitting-balance support: No upper extremity supported, Feet supported Sitting balance-Leahy Scale: Normal     Standing balance support: Single extremity supported, During functional activity Standing balance-Leahy Scale: Fair                             ADL either performed or assessed with clinical judgement   ADL Overall ADL's : Needs assistance/impaired                                       General ADL Comments: MAX A don/doff underwear and brace in standing. CGA + BRW for toielt t/f, IND for pericare.     Vision         Perception         Praxis         Pertinent Vitals/Pain Pain Assessment Pain Assessment: No/denies pain     Extremity/Trunk Assessment Upper Extremity Assessment Upper Extremity Assessment: Overall WFL for tasks assessed   Lower Extremity Assessment Lower Extremity Assessment: Overall WFL for tasks assessed       Communication Communication Communication: No apparent difficulties   Cognition Arousal: Alert Behavior During Therapy: WFL for tasks assessed/performed Cognition: No apparent impairments  Following commands: Intact       Cueing  General Comments          Exercises     Shoulder Instructions      Home Living Family/patient expects to be discharged to:: Private residence Living Arrangements: Spouse/significant other;Children Available Help at Discharge: Family;Available 24 hours/day Type of Home: House Home Access: Stairs to enter [employer] of Steps: 1   Home Layout: Two level;Able to live on main level with bedroom/bathroom         Bathroom Toilet: Standard     Home Equipment: None          Prior Functioning/Environment Prior Level of Function : Independent/Modified Independent;Driving                     OT Problem List: Impaired balance (sitting and/or standing);Decreased activity tolerance;Decreased range of motion   OT Treatment/Interventions:        OT Goals(Current goals can be found in the care plan section)   Acute Rehab OT Goals Patient Stated Goal: to go home OT Goal Formulation: With patient/family Time For Goal Achievement: 04/03/24 Potential to Achieve Goals: Good   OT Frequency:       Co-evaluation              AM-PAC OT 6 Clicks Daily Activity     Outcome Measure Help from another person eating meals?: None Help from another person taking care of personal grooming?: None Help from another person toileting, which includes using toliet, bedpan, or urinal?: A Little Help from another person bathing (including washing, rinsing, drying)?: A Lot Help from another person to put on and taking off regular upper body clothing?: None Help from another person to put on and taking off regular lower body clothing?: A Lot 6 Click Score: 19   End of Session Equipment Utilized During Treatment: Rolling walker (2 wheels)  Activity Tolerance: Patient tolerated treatment well Patient left: in bed;with call bell/phone within reach;with family/visitor present  OT Visit Diagnosis: Unsteadiness on feet (R26.81)                Time: 9147-8295 OT Time Calculation (min): 34 min Charges:  OT General Charges $OT Visit: 1 Visit OT Evaluation $OT Eval Low Complexity: 1 Low OT Treatments $Self Care/Home Management : 23-37 mins  Gordan Latina, M.S. OTR/L  04/03/24, 3:56 PM  ascom [phone]

## 2024-04-03 NOTE — Discharge Instructions (Addendum)
 Hip Arthroscopy Post-Operative Instructions  1. Physical Therapy should start within 3-4 days of surgery. If your therapist has ANY questions, please ask them to call our offices. 2. If oozing from surgery site occurs, and the dressing appears soaked with bloody fluid please change the dressing as needed. This normally occurs after fluid irrigation during surgery, and will resolve within 24-36 hours. 3. Icing is very important for the first 5-7 days postoperative, and ice is applied (ice packs or ice therapy) as often as possible or at least for 20-minute periods 3-4 times per day. Ice should not be applied directly on the skin. 4. Physical therapist will remove dressing at 1st PT visit. 5. Apply Band-Aids to wound sites and change them once a day. Keep the wound clean and dry. 6. Please do not use bacitracin or other ointments under the bandage. 7. Showering is allowed on post-op day #4 if the wound is dry. MAKE SURE EACH INCISION IS COVERED WITH A WATERPROOF BANDAID DURING SHOWER ONLY! 8. Do not soak the hip in water in a bathtub or pool until the sutures are removed. Typically getting into a bath or pool is permitted 4 weeks after surgery.  9. Driving is permitted after 1 week for L hip surgery only if the narcotic pain medication is no longer being taken and you feel comfortable getting into and out of a car. For R hip surgery, driving is permitted after 2 weeks, but frequently may take up to 4 weeks to feel comfortable 10. Please ensure you have a follow-up appointment for suture removal ~2 weeks after surgery.  11. The anesthetic drugs used during your surgery may cause nausea for the first 24 hours. If nausea is encountered, drink only clear liquids (i.e. Sprite or 7-up). The only solids should be dry crackers or toast. If nausea and vomiting become severe or the patient shows sign of dehydration (lack of urination) please call the doctor or the surgicenter. 12. If you develop a fever (101.5),  redness, or yellow/brown/green drainage from the surgical incision site, please call our office to arrange for an evaluation.  13: POST-OPERATIVE PRESCRIPTIONS:  HETERTOPIC BONE PROPHYLAXIS FOR 30 DAYS: 1. EC-Naprosyn 500mg , 1 tablet by mouth two times per day x 30 days 2. Prilosec (Stomach Prophylaxis) 20mg , 1 tablet by mouth daily (take on an empty stomach x 30 days  DVT PROPHYLAXIS 3. Aspirin 325mg  by mouth daily x 4 weeks  PAIN MEDICATION:  4. Oxycodone  1 to 3 tablets by mouth every 4 hours as needed 5. Tylenol  1000mg  three times day for at least 5 days, then as needed to reduce narcotics  ANTI-NAUSEA (if applicable):  6. Zofran  4mg  tablet, 1 tablet every 6 hours as needed. You will be given a prescription, but it is optional to fill it.  ANTI-SPASM (if applicable):  7. Zanaflex 4mg , 1-2 tablets by mouth every 6 hours as needed.  ANTI-CONSTIPATION 8. Colace 100mg , 2 tablets by mouth daily (to prevent constipation with use of narcotic medications)  14. You will take as aspirin (325 mg) daily x 4 weeks. This may lower the risk of a blood clot developing after surgery. Should severe calf pain occur or significant swelling of calf and ankle, please call our offices. 15. Local anesthetics (i.e. Novocaine) are put into the incision after surgery. It is not uncommon for patients to encounter more pain on the first or second day after surgery. This is the time when swelling peaks. Taking pain medication before bedtime will assist in sleeping.  It is important not to drink alcohol or drive while taking narcotic medication. You should resume your normal medications for other conditions the day after surgery. 16. Follow weight bearing instructions as advised at discharge. Crutches may be necessary to assist walking. Extremity elevation for the first 72 hours is also encouraged to minimize swelling. 17. If unexpected problems occur and you need to speak to the doctor, call the  office.   Important Contact Information Raye Cai Producer, television/film/video): [phone] Fax Number: [phone]

## 2024-04-03 NOTE — Anesthesia Preprocedure Evaluation (Signed)
 Anesthesia Evaluation  Patient identified by MRN, date of birth, ID band Patient awake    Reviewed: Allergy & Precautions, H&P , NPO status , Patient's Chart, lab work & pertinent test results, reviewed documented beta blocker date and time   History of Anesthesia Complications (+) PONV and history of anesthetic complications  Airway Mallampati: III  TM Distance: >3 FB Neck ROM: full    Dental  (+) Dental Advidsory Given, Teeth Intact   Pulmonary neg pulmonary ROS, former smoker   Pulmonary exam normal breath sounds clear to auscultation       Cardiovascular Exercise Tolerance: Good negative cardio ROS Normal cardiovascular exam Rhythm:regular Rate:Normal     Neuro/Psych negative neurological ROS  negative psych ROS   GI/Hepatic Neg liver ROS,GERD  ,,  Endo/Other  neg diabetes  Class 3 obesity  Renal/GU negative Renal ROS  negative genitourinary   Musculoskeletal   Abdominal   Peds  Hematology  (+) Blood dyscrasia, anemia   Anesthesia Other Findings Past Medical History: No date: Anemia No date: GERD (gastroesophageal reflux disease) No date: Hepatomegaly No date: History of blood transfusion No date: History of COVID-19 2012: History of methicillin resistant staphylococcus aureus (MRSA) No date: Lumbar spondylosis No date: PCOS (polycystic ovarian syndrome) No date: PONV (postoperative nausea and vomiting) 2022: PVT (portal vein thrombosis)     Comment:  saw hematology Randy Buttery) and hypercoag w/u negative No date: Tobacco use disorder 03/25/2024: UTI (urinary tract infection)   Reproductive/Obstetrics negative OB ROS                             Anesthesia Physical Anesthesia Plan  ASA: 3  Anesthesia Plan: General   Post-op Pain Management:    Induction: Intravenous  PONV Risk Score and Plan: 4 or greater and Ondansetron , Dexamethasone, Propofol infusion, TIVA, Midazolam and  Treatment may vary due to age or medical condition  Airway Management Planned: Oral ETT  Additional Equipment:   Intra-op Plan:   Post-operative Plan: Extubation in OR  Informed Consent: I have reviewed the patients History and Physical, chart, labs and discussed the procedure including the risks, benefits and alternatives for the proposed anesthesia with the patient or authorized representative who has indicated his/her understanding and acceptance.     Dental Advisory Given  Plan Discussed with: Anesthesiologist, CRNA and Surgeon  Anesthesia Plan Comments:         Anesthesia Quick Evaluation

## 2024-04-04 ENCOUNTER — Encounter: Payer: Self-pay | Admitting: Orthopedic Surgery

## 2024-04-04 NOTE — Anesthesia Postprocedure Evaluation (Signed)
 Anesthesia Post Note  Patient: Amy Maxwell  Procedure(s) Performed: ARTHROSCOPY HIP (Right: Hip)  Patient location during evaluation: PACU Anesthesia Type: General Level of consciousness: awake and alert Pain management: pain level controlled Vital Signs Assessment: post-procedure vital signs reviewed and stable Respiratory status: spontaneous breathing, nonlabored ventilation, respiratory function stable and patient connected to nasal cannula oxygen Cardiovascular status: blood pressure returned to baseline and stable Postop Assessment: no apparent nausea or vomiting Anesthetic complications: no   No notable events documented.   Last Vitals:  Vitals:   04/03/24 1254 04/03/24 1600  BP: 110/62 116/66  Pulse: 85 77  Resp: 17 18  Temp: 37 C 36.4 C  SpO2: 98% 97%    Last Pain:  Vitals:   04/03/24 1600  TempSrc: Temporal  PainSc: 3                  Vanice Genre

## 2024-06-14 ENCOUNTER — Other Ambulatory Visit: Payer: Self-pay

## 2024-06-14 ENCOUNTER — Encounter: Payer: Self-pay | Admitting: Oncology

## 2024-06-14 ENCOUNTER — Inpatient Hospital Stay: Attending: Oncology | Admitting: Oncology

## 2024-06-14 DIAGNOSIS — D508 Other iron deficiency anemias: Secondary | ICD-10-CM

## 2024-06-14 DIAGNOSIS — I81 Portal vein thrombosis: Secondary | ICD-10-CM

## 2024-06-14 DIAGNOSIS — D509 Iron deficiency anemia, unspecified: Secondary | ICD-10-CM | POA: Insufficient documentation

## 2024-06-14 NOTE — Progress Notes (Signed)
 Had pelvic done recently and hip surgery done in June.  Severely anemic from chronic blood loss.  Physio and orthopedic d/t L5 & S1.

## 2024-06-18 ENCOUNTER — Encounter: Payer: Self-pay | Admitting: Oncology

## 2024-06-18 NOTE — Progress Notes (Signed)
 I connected with Amy Maxwell on 06/18/24 at 11:00 AM EDT by video enabled telemedicine visit and verified that I am speaking with the correct person using two identifiers.   I discussed the limitations, risks, security and privacy concerns of performing an evaluation and management service by telemedicine and the availability of in-person appointments. I also discussed with the patient that there may be a patient responsible charge related to this service. The patient expressed understanding and agreed to proceed.  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  Hypercoagulable workup was negative including protein C protein S Antithrombin III  factor V Leiden prothrombin gene mutation as well as antiphospholipid antibody syndrome and JAK2 mutation testing.  She was on anticoagulation for 6 months and then stopped.  She is now seeing me for iron deficiency anemia.  Interval history patient presently reports that her menstrual cycles are heavy.  She has known history of fibroids.  She is seeing GYN for this.  She plans to undergo hysterectomy soon.  Currently reports ongoing fatigue.  Denies other complaints   Review of Systems  Constitutional:  Positive for malaise/fatigue.  Genitourinary:        Menorrhagia    No Known Allergies  Past Medical History:  Diagnosis Date   Anemia    GERD (gastroesophageal reflux disease)    Hepatomegaly    History of blood transfusion    History of COVID-19    History of methicillin resistant staphylococcus aureus (MRSA) 2012   Lumbar spondylosis    PCOS (polycystic ovarian syndrome)    PONV  (postoperative nausea and vomiting)    PVT (portal vein thrombosis) 2022   saw hematology Linnea) and hypercoag w/u negative   Tobacco use disorder    UTI (urinary tract infection) 03/25/2024    Past Surgical History:  Procedure Laterality Date   APPENDECTOMY     CARPAL TUNNEL RELEASE Left    CESAREAN SECTION     x3   HIP ARTHROSCOPY Right 04/03/2024   Procedure: ARTHROSCOPY HIP;  Surgeon: Tobie Priest, MD;  Location: [facility] ORS;  Service: Orthopedics;  Laterality: Right;  Right hip arthroscopy, acetabuloplasty, labral repair, femoral osteochondroplasty, capsular closure   TONSILECTOMY, ADENOIDECTOMY, BILATERAL MYRINGOTOMY AND TUBES     TONSILLECTOMY AND ADENOIDECTOMY     VENTRAL HERNIA REPAIR      Social History   Socioeconomic History   Marital status: Married    Spouse name: Not on file   Number of children: Not on file   Years of education: Not on file   Highest education level: Not on file  Occupational History   Not on file  Tobacco Use   Smoking status: Former    Current packs/day: 0.50    Average packs/day: 0.5 packs/day for 2.0 years (1.0 ttl pk-yrs)    Types: Cigarettes   Smokeless tobacco: Never  Vaping Use   Vaping status: Never Used  Substance and Sexual Activity   Alcohol use: Not Currently   Drug use: Yes    Types: Marijuana   Sexual activity: Not on file  Other Topics Concern   Not on file  Social History Narrative   Not on file  Social Drivers of Corporate investment banker Strain: Low Risk  (06/07/2024)   Received from [facility]   Overall Financial Resource Strain (CARDIA)    Difficulty of Paying Living Expenses: Not hard at all  Food Insecurity: No Food Insecurity (06/07/2024)   Received from [facility]   Hunger Vital Sign    Within the past 12 months, you worried that your food would run out before you got the money to buy more.: Never true    Within the past 12 months, the food you bought just didn't last  and you didn't have money to get more.: Never true  Transportation Needs: No Transportation Needs (06/07/2024)   Received from [facility] - Transportation    In the past 12 months, has lack of transportation kept you from medical appointments or from getting medications?: No    Lack of Transportation (Non-Medical): No  Physical Activity: Not on file  Stress: Not on file  Social Connections: Not on file  Intimate Partner Violence: Not on file    Family History  Problem Relation Age of Onset   Cirrhosis Father    Alcoholism Father      Current Outpatient Medications:    famotidine  (PEPCID ) 20 MG tablet, Take 20 mg by mouth daily as needed for heartburn or indigestion., Disp: , Rfl:    gabapentin (NEURONTIN) 300 MG capsule, Take 300 mg by mouth daily as needed (severe pain.)., Disp: , Rfl:    lactobacillus acidophilus (BACID) TABS tablet, Take 1 tablet by mouth., Disp: , Rfl:    Menthol-Methyl Salicylate (TIGER BALM LINIMENT EX), Apply 1 Application topically as needed (pain.)., Disp: , Rfl:    Multiple Vitamin (MULTIVITAMIN WITH MINERALS) TABS tablet, Take 1 tablet by mouth every Monday, Tuesday, Wednesday, Thursday, and Friday. Women's Multivitamin, Disp: , Rfl:    naproxen  (NAPROSYN ) 500 MG tablet, Take 1 tablet (500 mg total) by mouth 2 (two) times daily with a meal., Disp: 60 tablet, Rfl: 0   omeprazole  (PRILOSEC  OTC) 20 MG tablet, Take 1 tablet (20 mg total) by mouth daily., Disp: 30 tablet, Rfl: 0   ondansetron  (ZOFRAN -ODT) 4 MG disintegrating tablet, Take 1 tablet (4 mg total) by mouth every 8 (eight) hours as needed for nausea or vomiting., Disp: 20 tablet, Rfl: 0   SEMAGLUTIDE-WEIGHT MANAGEMENT [place], Inject 50 Units into the skin every Tuesday., Disp: , Rfl:    acetaminophen  (TYLENOL ) 500 MG tablet, Take 2 tablets (1,000 mg total) by mouth every 8 (eight) hours. (Patient not taking: Reported on 06/14/2024), Disp: 90 tablet, Rfl: 2   docusate sodium   (COLACE) 100 MG capsule, Take 1 capsule (100 mg total) by mouth daily as needed. (Patient not taking: Reported on 06/14/2024), Disp: 30 capsule, Rfl: 2   nitrofurantoin (MACRODANTIN) 100 MG capsule, Take 100 mg by mouth 2 (two) times daily. (Patient not taking: Reported on 06/14/2024), Disp: , Rfl:    oxyCODONE  (ROXICODONE ) 5 MG immediate release tablet, Take 1-2 tablets (5-10 mg total) by mouth every 4 (four) hours as needed (pain). (Patient not taking: Reported on 06/14/2024), Disp: 30 tablet, Rfl: 0   tiZANidine  (ZANAFLEX ) 4 MG tablet, Take 1-2 tablets (4-8 mg total) by mouth 3 (three) times daily. (Patient not taking: Reported on 06/14/2024), Disp: 90 tablet, Rfl: 0  No results found.  No images are attached to the encounter.      Latest Ref Rng & Units 03/26/2024   10:53 AM  CMP  Glucose 70 - 99 mg/dL 95   BUN 6 - 20 mg/dL 15   Creatinine 9.55 - 1.00 mg/dL 9.43   Sodium 864 - 854 mmol/L 140   Potassium 3.5 - 5.1 mmol/L 3.9   Chloride 98 - 111 mmol/L 108   CO2 22 - 32 mmol/L 23   Calcium 8.9 - 10.3 mg/dL 8.9       Latest Ref Rng & Units 03/26/2024   10:53 AM  CBC  WBC 4.0 - 10.5 K/uL 7.6   Hemoglobin 12.0 - 15.0 g/dL 8.2   Hematocrit 63.9 - 46.0 % 28.3   Platelets 150 - 400 K/uL 420      Observation/objective: Appears in no acute distress over video visit today.  Breathing is nonlabored  Assessment and plan: Patient is a 43 year old female with prior history of unprovoked portal venous thrombosis status post 6 months of anticoagulation now referred for iron deficiency anemia  Iron deficiency anemia secondary to abnormal uterine bleeding for which she is seeing GYN.  She plans to undergo hysterectomy soon.  She had labworkdone on 06/08/2024 which showed H&H of 8/27.7 with an MCV of 71.7.  Ferritin levels were low at 3 with iron saturation of 13%.  She would therefore benefit from IV iron.  Discussed acetaminophens of IV iron including all but not limited to possible risk of infusion  anaphylactic reaction.  We will plan to give her 2 doses of Feraheme.  Patient understands and agrees to proceed as planned.  CBC ferritin and iron studies in 6 and 12 weeks and I will see her back in 12 weeks.  We will also check B12 folate and TSH in 6 weeks  Follow-up instructions:as above  I discussed the assessment and treatment plan with the patient. The patient was provided an opportunity to ask questions and all were answered. The patient agreed with the plan and demonstrated an understanding of the instructions.   The patient was advised to call back or seek an in-person evaluation if the symptoms worsen or if the condition fails to improve as anticipated.  I provided 15 minutes of face-to-face video visit time during this encounter, and > 50% was spent counseling as documented under my assessment & plan.  Visit Diagnosis: 1. PVT (portal vein thrombosis)   2. Other iron deficiency anemia     Dr. Annah Skene, MD, MPH [facility] Tel- [phone] 06/18/2024 5:43 PM

## 2024-06-25 ENCOUNTER — Inpatient Hospital Stay: Attending: Oncology

## 2024-06-25 VITALS — BP 111/74 | HR 90 | Temp 96.2°F | Resp 19

## 2024-06-25 DIAGNOSIS — D508 Other iron deficiency anemias: Secondary | ICD-10-CM | POA: Diagnosis present

## 2024-06-25 DIAGNOSIS — Z79899 Other long term (current) drug therapy: Secondary | ICD-10-CM | POA: Insufficient documentation

## 2024-06-25 MED ORDER — SODIUM CHLORIDE 0.9 % IV SOLN
INTRAVENOUS | Status: DC
  Filled 2024-06-25: qty 250

## 2024-06-25 MED ORDER — SODIUM CHLORIDE 0.9 % IV SOLN
510.0000 mg | INTRAVENOUS | Status: DC
  Administered 2024-06-25: 510 mg via INTRAVENOUS
  Filled 2024-06-25: qty 510

## 2024-06-28 ENCOUNTER — Encounter: Payer: Self-pay | Admitting: Oncology

## 2024-06-28 DIAGNOSIS — D508 Other iron deficiency anemias: Secondary | ICD-10-CM

## 2024-06-28 DIAGNOSIS — I81 Portal vein thrombosis: Secondary | ICD-10-CM

## 2024-07-02 ENCOUNTER — Inpatient Hospital Stay

## 2024-07-02 ENCOUNTER — Encounter: Payer: Self-pay | Admitting: Oncology

## 2024-07-02 VITALS — BP 114/69 | HR 83 | Temp 96.0°F | Resp 19

## 2024-07-02 DIAGNOSIS — D508 Other iron deficiency anemias: Secondary | ICD-10-CM | POA: Diagnosis not present

## 2024-07-02 MED ORDER — ACETAMINOPHEN 325 MG PO TABS
650.0000 mg | ORAL_TABLET | Freq: Once | ORAL | Status: AC
  Administered 2024-07-02: 650 mg via ORAL
  Filled 2024-07-02: qty 2

## 2024-07-02 MED ORDER — SODIUM CHLORIDE 0.9 % IV SOLN
510.0000 mg | INTRAVENOUS | Status: DC
  Administered 2024-07-02: 510 mg via INTRAVENOUS
  Filled 2024-07-02: qty 510

## 2024-07-02 MED ORDER — SODIUM CHLORIDE 0.9 % IV SOLN
INTRAVENOUS | Status: DC
  Filled 2024-07-02: qty 250

## 2024-07-02 NOTE — Telephone Encounter (Signed)
 We can start with doppler rle to r/o dvt.

## 2024-07-02 NOTE — Progress Notes (Signed)
 Pt developed a headache after last Feraheme infusion. Per Dr. Melanee okay to give 650 mg PO Tylenol  prior to Feraheme at this time.

## 2024-07-04 ENCOUNTER — Ambulatory Visit

## 2024-07-05 ENCOUNTER — Inpatient Hospital Stay
Admission: RE | Admit: 2024-07-05 | Discharge: 2024-07-05 | Disposition: A | Discharge status: Home / Self Care | Payer: Self-pay | Source: Ambulatory Visit | Attending: Neurosurgery | Admitting: Neurosurgery

## 2024-07-05 ENCOUNTER — Other Ambulatory Visit: Payer: Self-pay | Admitting: Family Medicine

## 2024-07-05 DIAGNOSIS — Z049 Encounter for examination and observation for unspecified reason: Secondary | ICD-10-CM

## 2024-07-06 NOTE — Progress Notes (Signed)
 Referring Physician:  Carlisle Benton CROME, FNP [address] [place],  [place] [address]  Primary Physician:  Franchot Houston, PA-C  History of Present Illness: 07/10/2024 [name] is here today with a chief complaint of numbness and tingling in her right leg.  Numbness and tingling in the right leg began suddenly before hip arthroscopy in June. The numbness starts on the lateral aspect of the right leg, extends downwards, and is also present on the posterior and anterior aspects. The area is sensitive to temperature, often feeling hotter than the rest of the body.  Symptoms worsen with prolonged walking and sitting, such as during long drives. Significant swelling occurs in the right leg if socks are not worn. There is no significant pain, but nerve pain occurs with excessive walking. An EMG of the right leg showed normal results.  There is a history of disc herniation. Post-hip arthroscopy, her hip condition has improved significantly. She is right-side dominant and notes weakness in the right leg compared to the left. No significant issues are present with the left leg.  Discussed the use of AI scribe software for clinical note transcription with the patient, who gave verbal consent to proceed. Bowel/Bladder Dysfunction: none  Conservative measures:  Physical therapy: has participated in for her back in 2022.  Multimodal medical therapy including regular antiinflammatories: Robaxin, Prednisone, Tizanidine , Ibuprofen , Tylenol , Flexeril, Tramadol, Gabapentin, Oxycodone  Injections: 06/06/2024: Right L5-S1 and right S1 transforaminal ESI (improvement of pain in the back continued feeling of weakness in the right lower extremity dorsiflexor with numbness and tingling to the thigh) 02/15/2024: Right hip joint injection by Dr. Sharrie (good relief) 03/23/2022: Right hip joint injection (100% relief) 02/19/2022: Right sacroiliac joint injection (good relief of right low back  pain continued with limitation in rotation of the right hip and difficulty raising the right leg to step) 08/18/2022: SI joint injection (good relief for 5 months) [facility] in [place]  04/28/2021: Bilateral L5-S1 transforaminal ESI (no relief) 03/24/2021: Right L5-S1 transforaminal ESI ( 3 days of good relief following epidural steroid injection and continued mild benefit and complete relief of pain in the calf.)   Past Surgery: no spinal surgery  Amy Maxwell has no symptoms of cervical myelopathy.  The symptoms are causing a significant impact on the patient's life.   I have utilized the care everywhere function in epic to review the outside records available from external health systems.   Review of Systems:  A 10 point review of systems is negative, except for the pertinent positives and negatives detailed in the HPI.  Past Medical History: Past Medical History:  Diagnosis Date   Anemia    GERD (gastroesophageal reflux disease)    Hepatomegaly    History of blood transfusion    History of COVID-19    History of methicillin resistant staphylococcus aureus (MRSA) 2012   Lumbar spondylosis    PCOS (polycystic ovarian syndrome)    PONV (postoperative nausea and vomiting)    PVT (portal vein thrombosis) 2022   saw hematology Linnea) and hypercoag w/u negative   Tobacco use disorder    UTI (urinary tract infection) 03/25/2024    Past Surgical History: Past Surgical History:  Procedure Laterality Date   APPENDECTOMY     CARPAL TUNNEL RELEASE Left    CESAREAN SECTION     x3   HIP ARTHROSCOPY Right 04/03/2024   Procedure: ARTHROSCOPY HIP;  Surgeon: Tobie Priest, MD;  Location: [facility] ORS;  Service: Orthopedics;  Laterality: Right;  Right hip arthroscopy, acetabuloplasty, labral repair, femoral osteochondroplasty, capsular closure   TONSILECTOMY, ADENOIDECTOMY, BILATERAL MYRINGOTOMY AND TUBES     TONSILLECTOMY AND ADENOIDECTOMY     VENTRAL HERNIA REPAIR       Allergies: Allergies as of 07/10/2024   (No Known Allergies)    Medications:  Current Outpatient Medications:    acetaminophen  (TYLENOL ) 500 MG tablet, Take 2 tablets (1,000 mg total) by mouth every 8 (eight) hours. (Patient not taking: Reported on 06/14/2024), Disp: 90 tablet, Rfl: 2   docusate sodium  (COLACE) 100 MG capsule, Take 1 capsule (100 mg total) by mouth daily as needed. (Patient not taking: Reported on 06/14/2024), Disp: 30 capsule, Rfl: 2   famotidine  (PEPCID ) 20 MG tablet, Take 20 mg by mouth daily as needed for heartburn or indigestion., Disp: , Rfl:    gabapentin (NEURONTIN) 300 MG capsule, Take 300 mg by mouth daily as needed (severe pain.)., Disp: , Rfl:    lactobacillus acidophilus (BACID) TABS tablet, Take 1 tablet by mouth., Disp: , Rfl:    Menthol-Methyl Salicylate (TIGER BALM LINIMENT EX), Apply 1 Application topically as needed (pain.)., Disp: , Rfl:    Multiple Vitamin (MULTIVITAMIN WITH MINERALS) TABS tablet, Take 1 tablet by mouth every Monday, Tuesday, Wednesday, Thursday, and Friday. Women's Multivitamin, Disp: , Rfl:    naproxen  (NAPROSYN ) 500 MG tablet, Take 1 tablet (500 mg total) by mouth 2 (two) times daily with a meal., Disp: 60 tablet, Rfl: 0   nitrofurantoin (MACRODANTIN) 100 MG capsule, Take 100 mg by mouth 2 (two) times daily. (Patient not taking: Reported on 06/14/2024), Disp: , Rfl:    omeprazole  (PRILOSEC  OTC) 20 MG tablet, Take 1 tablet (20 mg total) by mouth daily., Disp: 30 tablet, Rfl: 0   ondansetron  (ZOFRAN -ODT) 4 MG disintegrating tablet, Take 1 tablet (4 mg total) by mouth every 8 (eight) hours as needed for nausea or vomiting., Disp: 20 tablet, Rfl: 0   oxyCODONE  (ROXICODONE ) 5 MG immediate release tablet, Take 1-2 tablets (5-10 mg total) by mouth every 4 (four) hours as needed (pain). (Patient not taking: Reported on 06/14/2024), Disp: 30 tablet, Rfl: 0   SEMAGLUTIDE-WEIGHT MANAGEMENT [place], Inject 50 Units into the skin every Tuesday., Disp: ,  Rfl:    tiZANidine  (ZANAFLEX ) 4 MG tablet, Take 1-2 tablets (4-8 mg total) by mouth 3 (three) times daily. (Patient not taking: Reported on 06/14/2024), Disp: 90 tablet, Rfl: 0  Social History: Social History   Tobacco Use   Smoking status: Former    Current packs/day: 0.50    Average packs/day: 0.5 packs/day for 2.0 years (1.0 ttl pk-yrs)    Types: Cigarettes   Smokeless tobacco: Never  Vaping Use   Vaping status: Never Used  Substance Use Topics   Alcohol use: Not Currently   Drug use: Yes    Types: Marijuana    Family Medical History: Family History  Problem Relation Age of Onset   Cirrhosis Father    Alcoholism Father     Physical Examination: Vitals:   07/10/24 1304  BP: 116/76    General: Patient is in no apparent distress. Attention to examination is appropriate.  Neck:   Supple.  Full range of motion.  Respiratory: Patient is breathing without any difficulty.   NEUROLOGICAL:     Awake, alert, oriented to person, place, and time.  Speech is clear and fluent.   Cranial Nerves: Pupils equal round and reactive to light.  Facial tone is symmetric.  Facial sensation is symmetric. Shoulder shrug is symmetric.  Tongue protrusion is midline.  There is no pronator drift.  Strength: Side Biceps Triceps Deltoid Interossei Grip Wrist Ext. Wrist Flex.  R 5 5 5 5 5 5 5   L 5 5 5 5 5 5 5    Side Iliopsoas Quads Hamstring PF DF EHL  R 5 5 5 5 5 5   L 5 5 5 5 5 5    Reflexes are 1+ and symmetric at the biceps, triceps, brachioradialis, patella and achilles.   Hoffman's is absent.   Bilateral upper and lower extremity sensation is intact to light touch except R lateral thigh.    No evidence of dysmetria noted.  Gait is normal.     Medical Decision Making  Imaging: MRI L spine 03/26/2024 IMPRESSION: Slightly more prominent central to left paracentral disc extrusion at L5-S1 which posteriorly displaces the descending sacral nerve roots in the thecal sac as above.  There is no definitive impingement identified. See above for more detail.   Mild facet arthrosis is present.   Electronically signed by: Norleen Satchel MD 03/27/2024 02:43 PM EDT RP Workstation: [id number]  I have personally reviewed the images and agree with the above interpretation.  Assessment and Plan: Ms. Molchan is a pleasant 43 y.o. female with right lateral thigh numbness.  She has no area of compression on the right side.  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  I will contact her orthopedist and physiatrist to see if we can arrange for an injection of the lateral femoral cutaneous nerve.  We can also consider physical therapy.  I spent a total of 30 minutes in this patient's care today. This time was spent reviewing pertinent records including imaging studies, obtaining and confirming history, performing a directed evaluation, formulating and discussing my recommendations, and documenting the visit within the medical record.        Thank you for involving me in the care of this patient.      Kareli Hossain K. Clois MD, [facility] Neurosurgery

## 2024-07-10 ENCOUNTER — Ambulatory Visit (INDEPENDENT_AMBULATORY_CARE_PROVIDER_SITE_OTHER): Admitting: Neurosurgery

## 2024-07-10 ENCOUNTER — Encounter: Payer: Self-pay | Admitting: Neurosurgery

## 2024-07-10 VITALS — BP 116/76 | Ht 64.0 in | Wt 265.0 lb

## 2024-07-10 DIAGNOSIS — G5711 Meralgia paresthetica, right lower limb: Secondary | ICD-10-CM

## 2024-07-25 ENCOUNTER — Other Ambulatory Visit: Payer: Self-pay

## 2024-07-25 DIAGNOSIS — D508 Other iron deficiency anemias: Secondary | ICD-10-CM

## 2024-07-26 ENCOUNTER — Inpatient Hospital Stay: Attending: Oncology

## 2024-07-26 DIAGNOSIS — D508 Other iron deficiency anemias: Secondary | ICD-10-CM | POA: Diagnosis present

## 2024-07-26 DIAGNOSIS — Z79899 Other long term (current) drug therapy: Secondary | ICD-10-CM | POA: Insufficient documentation

## 2024-07-26 DIAGNOSIS — I81 Portal vein thrombosis: Secondary | ICD-10-CM

## 2024-07-26 LAB — CBC (CANCER CENTER ONLY)
HCT: 37 % (ref 36.0–46.0)
Hemoglobin: 12 g/dL (ref 12.0–15.0)
MCH: 26.6 pg (ref 26.0–34.0)
MCHC: 32.4 g/dL (ref 30.0–36.0)
MCV: 82 fL (ref 80.0–100.0)
Platelet Count: 372 K/uL (ref 150–400)
RBC: 4.51 MIL/uL (ref 3.87–5.11)
WBC Count: 8.4 K/uL (ref 4.0–10.5)
nRBC: 0 % (ref 0.0–0.2)

## 2024-07-26 LAB — VITAMIN B12: Vitamin B-12: 566 pg/mL (ref 180–914)

## 2024-07-26 LAB — IRON AND TIBC
Iron: 31 ug/dL (ref 28–170)
Saturation Ratios: 10 % — ABNORMAL LOW (ref 10.4–31.8)
TIBC: 316 ug/dL (ref 250–450)
UIBC: 285 ug/dL

## 2024-07-26 LAB — FERRITIN: Ferritin: 67 ng/mL (ref 11–307)

## 2024-07-26 LAB — FOLATE: Folate: 19.2 ng/mL (ref >5.9)

## 2024-07-26 LAB — TSH: TSH: 1.071 u[IU]/mL (ref 0.350–4.500)

## 2024-07-27 ENCOUNTER — Ambulatory Visit: Payer: Self-pay | Admitting: Oncology

## 2024-07-27 NOTE — Telephone Encounter (Signed)
-----   Message from Annah JAYSON Skene sent at 07/27/2024  8:35 AM EDT ----- [name] to schedule her for iv iron.  [name]- iron sat 10%. Even if ferritin normal we can give her iv iron if she is symptomatic ----- Message ----- From: Interface, Lab In [place] Sent: 07/26/2024  11:47 AM EDT To: Annah JAYSON Skene, MD

## 2024-07-27 NOTE — Telephone Encounter (Signed)
 Per Dr. Melanee Belton to schedule her for iv iron. Amy Maxwell- iron sat 10%. Even if ferritin normal we can give her iv iron if she is symptomatic.  Outbound call to patient; patient agreed to IV iron. With last infusion mentioned she had a small headache and felt a little blah.  Informed scheduling will be in touch shortly to coordinate; patient verbalized understanding.

## 2024-07-30 ENCOUNTER — Encounter: Payer: Self-pay | Admitting: Oncology

## 2024-07-30 NOTE — Telephone Encounter (Signed)
Infusions have been scheduled

## 2024-08-03 ENCOUNTER — Other Ambulatory Visit: Payer: Self-pay | Admitting: Oncology

## 2024-08-03 ENCOUNTER — Inpatient Hospital Stay

## 2024-08-03 VITALS — BP 121/68 | HR 80 | Temp 98.0°F | Resp 18

## 2024-08-03 DIAGNOSIS — D508 Other iron deficiency anemias: Secondary | ICD-10-CM | POA: Diagnosis not present

## 2024-08-03 MED ORDER — SODIUM CHLORIDE 0.9 % IV SOLN
510.0000 mg | Freq: Once | INTRAVENOUS | Status: AC
  Administered 2024-08-03: 510 mg via INTRAVENOUS
  Filled 2024-08-03: qty 510

## 2024-08-03 MED ORDER — SODIUM CHLORIDE 0.9 % IV SOLN
INTRAVENOUS | Status: DC
  Filled 2024-08-03: qty 250

## 2024-08-10 ENCOUNTER — Inpatient Hospital Stay

## 2024-08-10 VITALS — BP 118/88 | HR 87 | Temp 98.8°F | Resp 16

## 2024-08-10 DIAGNOSIS — D508 Other iron deficiency anemias: Secondary | ICD-10-CM | POA: Diagnosis not present

## 2024-08-10 MED ORDER — SODIUM CHLORIDE 0.9 % IV SOLN
INTRAVENOUS | Status: DC
  Filled 2024-08-10: qty 250

## 2024-08-10 MED ORDER — SODIUM CHLORIDE 0.9 % IV SOLN
510.0000 mg | Freq: Once | INTRAVENOUS | Status: AC
  Administered 2024-08-10: 510 mg via INTRAVENOUS
  Filled 2024-08-10: qty 510

## 2024-08-10 NOTE — Patient Instructions (Signed)

## 2024-08-21 NOTE — H&P (Signed)
 Amy Maxwell is a 43 y.o. female presenting for preop  on 08/21/2024   HPI:   Preop for RATLH,BS and cystoscopy   EMBx: 06/12/24: Part A-Endometrial Biopsy: NEGATIVE SQUAMOUS EPITHELIUM. EXTREMELY SCANTY.  NO ENDOMETRIAL TISSUE IDENTIFIED.    Pap smear: 06/12/24: Comment: NEGATIVE FOR INTRAEPITHELIAL LESION OR MALIGNANCY.      AUB- Fibroids x2 years, failed depo shot and OCPs x several versions. She has sx anemia, and has a hematology referral scheduled for iv iron.   Tvus 06/12/24: The sonogram reveals a retroverted uterus with two fibroids, 4 and 3cm. Volume: 261.8cm3   The endometrium appears normal.   The right ovary appears normal. The left ovary contains a complex unilocular hemorrhagic cyst measuring 27mm.   No free fluid seen   Pertienent hx:   She has a history of three C-sections and ventral hernia mesh placement on the right cesarean section angle, which resulted in MRSA infection and significant scarring. Her bladder exited through this fascial opening.       Past Medical History:  has a past medical history of Allergy, Anemia, Anxiety, Arthritis, Fibroid, Lumbar radiculitis, PCOS (polycystic ovarian syndrome), and Portal vein thrombosis.  Past Surgical History:  has a past surgical history that includes Cesarean section (2003); Cesarean section (2006); Cesarean section (2012); Repair Ventral Hernia Laparoscopic (2003); and Right hip arthroscopy with acetabuloplasty, labral repair, femoral osteochondroplasty, and capsular closure (Right, 04/03/2024). Family History: family history includes ADD / ADHD in her daughter; Alcohol abuse in her father and mother; Asthma in her mother; Breast cancer (age of onset: 79) in her maternal grandmother; Cervical cancer (age of onset: 98) in her paternal grandmother; Cirrhosis (age of onset: 38) in her father; Diabetes type II in her maternal grandmother; HIV in her maternal uncle; Multiple sclerosis in her brother; Substance Abuse in her  father; Sudden death (unexpected death due to unknown cause) in her maternal grandfather; Sudden death (unexpected death due to unknown cause) (age of onset: 36) in her mother. Social History:  reports that she quit smoking about 9 years ago. Her smoking use included cigarettes. She started smoking about 19 years ago. She has never used smokeless tobacco. She reports current alcohol use. She reports that she does not use drugs. OB/GYN History:  OB History       Gravida  3   Para  3   Term  3   Preterm      AB      Living  3        SAB      IAB      Ectopic      Molar      Multiple      Live Births                Allergies: has no known allergies. Medications:  Current Medications    Current Outpatient Medications:    acetaminophen  (TYLENOL ) 500 MG tablet, Take 500 mg by mouth every 8 (eight) hours as needed for Pain, Disp: , Rfl:    clindamycin (CLEOCIN T) 1 % swab, Apply 1 Application topically, Disp: , Rfl:    ferrous sulfate 325 (65 FE) MG tablet, Take 325 mg by mouth daily with breakfast, Disp: , Rfl:    gabapentin (NEURONTIN) 300 MG capsule, 1 po qHS, Disp: 30 capsule, Rfl: 2   Lactobacillus acidophilus (PROBIOTIC ORAL), Take 1 capsule by mouth once daily, Disp: , Rfl:    methocarbamoL (ROBAXIN) 750 MG tablet, 1/2-1 po qHS prn,  Disp: 30 tablet, Rfl: 0   multivit-minerals/folic acid (MULTIVITAMIN GUMMIES ORAL), Take 1 each by mouth once daily, Disp: , Rfl:    omeprazole  (PRILOSEC  OTC) 20 MG EC tablet, Take 20 mg by mouth, Disp: , Rfl:    phenazopyridine (PYRIDIUM) 200 MG tablet, TAKE 1 TABLET BY MOUTH THREE TIMES DAILY AS NEEDED FOR UP TO TWO DAYS (TAKE WITH OR AFTER MEALS), Disp: , Rfl:    SEMAGLUTIDE ORAL, Inject 1.5 mg subcutaneously, Disp: , Rfl:    spironolactone (ALDACTONE) 100 MG tablet, Take 100 mg (1 tablet daily). Start with 1/2 tablet (50 mg) daily for the first week and increase to 1 tablet if tolerated., Disp: , Rfl:    famotidine  (PEPCID ) 20 MG  tablet, Take 20 mg by mouth (Patient not taking: Reported on 08/21/2024), Disp: , Rfl:      Review of Systems: No SOB, no palpitations or chest pain, no new lower extremity edema, no nausea or vomiting or bowel or bladder complaints. See HPI for gyn specific ROS.    Exam:    Objective BP 118/82   Ht 162.6 cm (5' 4)   Wt (!) 116.6 kg (257 lb)   LMP 08/16/2024   BMI 44.11 kg/m    General: Patient is well-groomed, well-nourished, appears stated age in no acute distress   HEENT: head is atraumatic and normocephalic, trachea is midline, neck is supple with no palpable nodules   CV: Regular rhythm and normal heart rate, no murmur   Pulm: Clear to auscultation throughout lung fields with no wheezing, crackles, or rhonchi. No increased work of breathing     Pelvic: deferred Impression:    The primary encounter diagnosis was H/O uterine leiomyoma. Diagnoses of Abnormal uterine bleeding (AUB), Iron deficiency anemia due to chronic blood loss, and H/O cesarean section were also pertinent to this visit.       Plan:    Patient returns for a preoperative discussion regarding her plans to proceed with definitive surgical treatment of her AUB-F and sx anemia by  robotic assisted total laparoscopic hysterectomy with bilateral salpingectomy.   We discussed second opinion at [facility], which she would like to avoid if possible. We talked about peek and shriek, which she would rather over opening.   Plan for bladder backfilled because of prior surgical complications. Retroverted uterus. 100% Intramural fibroids BMI 44   The patient and I discussed the technical aspects of the procedure including the potential for risks and complications.  These include but are not limited to the risk of infection requiring post-operative antibiotics or further procedures.  We talked about the risk of injury to adjacent organs including bladder, bowel, ureter, blood vessels or nerves.  We talked about the need to  convert to an open incision.  We talked about the possible need for blood transfusion.  We talked about postop complications such as thromboembolic or cardiopulmonary complications.  All of her questions were answered.  Her preoperative exam was completed and the appropriate consents were signed. She is scheduled to undergo this procedure in the near future.   Return if symptoms worsen or fail to improve.

## 2024-08-29 ENCOUNTER — Other Ambulatory Visit: Payer: Self-pay

## 2024-08-29 ENCOUNTER — Encounter
Admission: RE | Admit: 2024-08-29 | Discharge: 2024-08-29 | Disposition: A | Discharge status: Home / Self Care | Source: Ambulatory Visit | Attending: Obstetrics and Gynecology | Admitting: Obstetrics and Gynecology

## 2024-08-29 VITALS — Ht 64.0 in | Wt 244.0 lb

## 2024-08-29 DIAGNOSIS — Z01818 Encounter for other preprocedural examination: Secondary | ICD-10-CM | POA: Diagnosis present

## 2024-08-29 DIAGNOSIS — I81 Portal vein thrombosis: Secondary | ICD-10-CM

## 2024-08-29 HISTORY — DX: Anxiety disorder, unspecified: F41.9

## 2024-08-29 HISTORY — DX: Iron deficiency anemia, unspecified: D50.9

## 2024-08-29 HISTORY — DX: Unspecified osteoarthritis, unspecified site: M19.90

## 2024-08-29 NOTE — Patient Instructions (Addendum)
 Your procedure is scheduled nw:[id number] 09/05/24 Report to the Registration Desk on the 1st floor of the [facility]. To find out your arrival time, please call [phone] between 1PM - 3PM on: TUESDAY 09/04/24 If your arrival time is 6:00 am, do not arrive before that time as the Medical Mall entrance doors do not open until 6:00 am.  REMEMBER: Instructions that are not followed completely may result in serious medical risk, up to and including death; or upon the discretion of your surgeon and anesthesiologist your surgery may need to be rescheduled.  Do not eat food after midnight the night before surgery.  No gum chewing or hard candies.  You may however, drink CLEAR liquids up to 2 hours before you are scheduled to arrive for your surgery. Do not drink anything within 2 hours of your scheduled arrival time.  Clear liquids include: - water  - apple juice without pulp - gatorade (not RED colors) - black coffee or tea (Do NOT add milk or creamers to the coffee or tea) Do NOT drink anything that is not on this list.  One week prior to surgery: Stop Anti-inflammatories (NSAIDS) such as methocarbamol (ROBAXIN), Advil , Aleve , Ibuprofen , Motrin , Naproxen , Naprosyn  and Aspirin  based products such as Excedrin, Goody's Powder, BC Powder.  Stop ANY OVER THE COUNTER supplements until after surgery.  STOP SEMAGLUTIDE 7 days prior to surgery. Last dose  08/28/24  You may however, continue to take Tylenol  if needed for pain up until the day of surgery.  Continue taking all of your other prescription medications up until the day of surgery.  ON THE DAY OF SURGERY ONLY TAKE THESE MEDICATIONS WITH SIPS OF WATER:  omeprazole  (PRILOSEC  OTC)  gabapentin (NEURONTIN)   Use inhalers on the day of surgery and bring to the hospital.  No Alcohol for 24 hours before or after surgery.  No Smoking including e-cigarettes for 24 hours before surgery.  No chewable tobacco products for at least 6  hours before surgery.  No nicotine patches on the day of surgery.  Do not use any recreational drugs for at least a week (preferably 2 weeks) before your surgery.  Please be advised that the combination of cocaine and anesthesia may have negative outcomes, up to and including death. If you test positive for cocaine, your surgery will be cancelled.  On the morning of surgery brush your teeth with toothpaste and water, you may rinse your mouth with mouthwash if you wish. Do not swallow any toothpaste or mouthwash.  Use CHG Soap or wipes as directed on instruction sheet.  Do not wear jewelry, make-up, hairpins, clips or nail polish.  For welded (permanent) jewelry: bracelets, anklets, waist bands, etc.  Please have this removed prior to surgery.  If it is not removed, there is a chance that hospital personnel will need to cut it off on the day of surgery.  Do not wear lotions, powders, or perfumes.   Do not shave body hair from the neck down 48 hours before surgery.  Contact lenses, hearing aids and dentures may not be worn into surgery.  Do not bring valuables to the hospital. [facility] is not responsible for any missing/lost belongings or valuables.    Notify your doctor if there is any change in your medical condition (cold, fever, infection).  Wear comfortable clothing (specific to your surgery type) to the hospital.  After surgery, you can help prevent lung complications by doing breathing exercises.  Take deep breaths and cough every 1-2  hours. Your doctor may order a device called an Incentive Spirometer to help you take deep breaths. When coughing or sneezing, hold a pillow firmly against your incision with both hands. This is called "splinting." Doing this helps protect your incision. It also decreases belly discomfort.  If you are being discharged the day of surgery, you will not be allowed to drive home. You will need a responsible individual to drive you home and stay  with you for 24 hours after surgery.   If you are taking public transportation, you will need to have a responsible individual with you.  Please call the Pre-admissions Testing Dept. at [phone] if you have any questions about these instructions.  Surgery Visitation Policy:  Patients having surgery or a procedure may have two visitors.  Children under the age of 16 must have an adult with them who is not the patient.  Merchandiser, Retail to address health-related social needs:  [email or web address].[email or web address].no                                                                                                            Preparing for Surgery with CHLORHEXIDINE  GLUCONATE (CHG) Soap  Chlorhexidine  Gluconate (CHG) Soap  o An antiseptic cleaner that kills germs and bonds with the skin to continue killing germs even after washing  o Used for showering the night before surgery and morning of surgery  Before surgery, you can play an important role by reducing the number of germs on your skin.  CHG (Chlorhexidine  gluconate) soap is an antiseptic cleanser which kills germs and bonds with the skin to continue killing germs even after washing.  Please do not use if you have an allergy to CHG or antibacterial soaps. If your skin becomes reddened/irritated stop using the CHG.  1. Shower the NIGHT BEFORE SURGERY with CHG soap.  2. If you choose to wash your hair, wash your hair first as usual with your normal shampoo.  3. After shampooing, rinse your hair and body thoroughly to remove the shampoo.  4. Use CHG as you would any other liquid soap. You can apply CHG directly to the skin and wash gently with a clean washcloth.  5. Apply the CHG soap to your body only from the neck down. Do not use on open wounds or open sores. Avoid contact with your eyes, ears, mouth, and genitals (private parts). Wash face and genitals (private parts) with your normal soap.  6. Wash thoroughly, paying  special attention to the area where your surgery will be performed.  7. Thoroughly rinse your body with warm water.  8. Do not shower/wash with your normal soap after using and rinsing off the CHG soap.  9. Do not use lotions, oils, etc., after showering with CHG.  10. Pat yourself dry with a clean towel.  11. Wear clean pajamas to bed the night before surgery.  12. Place clean sheets on your bed the night of your shower and do not sleep with pets.  13. Do not apply any deodorants/lotions/powders.  14. Please  wear clean clothes to the hospital.  15. Remember to brush your teeth with your regular toothpaste.

## 2024-08-31 ENCOUNTER — Encounter
Admission: RE | Admit: 2024-08-31 | Discharge: 2024-08-31 | Disposition: A | Discharge status: Home / Self Care | Source: Ambulatory Visit | Attending: Obstetrics and Gynecology | Admitting: Obstetrics and Gynecology

## 2024-08-31 ENCOUNTER — Encounter: Payer: Self-pay | Admitting: Urgent Care

## 2024-08-31 DIAGNOSIS — Z01818 Encounter for other preprocedural examination: Secondary | ICD-10-CM | POA: Insufficient documentation

## 2024-08-31 DIAGNOSIS — I81 Portal vein thrombosis: Secondary | ICD-10-CM | POA: Insufficient documentation

## 2024-08-31 DIAGNOSIS — D219 Benign neoplasm of connective and other soft tissue, unspecified: Secondary | ICD-10-CM | POA: Diagnosis not present

## 2024-08-31 LAB — BASIC METABOLIC PANEL WITH GFR
Anion gap: 13 (ref 5–15)
BUN: 12 mg/dL (ref 6–20)
CO2: 23 mmol/L (ref 22–32)
Calcium: 9.7 mg/dL (ref 8.9–10.3)
Chloride: 102 mmol/L (ref 98–111)
Creatinine, Ser: 0.62 mg/dL (ref 0.44–1.00)
GFR, Estimated: >60 mL/min (ref >60)
Glucose, Bld: 84 mg/dL (ref 70–99)
Potassium: 3.7 mmol/L (ref 3.5–5.1)
Sodium: 138 mmol/L (ref 135–145)

## 2024-08-31 LAB — TYPE AND SCREEN
ABO/RH(D): A POS
Antibody Screen: NEGATIVE

## 2024-09-05 ENCOUNTER — Other Ambulatory Visit: Payer: Self-pay

## 2024-09-05 ENCOUNTER — Encounter
Admission: RE | Disposition: A | Discharge status: Home / Self Care | Payer: Self-pay | Source: Home / Self Care | Attending: Obstetrics and Gynecology

## 2024-09-05 ENCOUNTER — Encounter: Payer: Self-pay | Admitting: Obstetrics and Gynecology

## 2024-09-05 ENCOUNTER — Ambulatory Visit

## 2024-09-05 ENCOUNTER — Ambulatory Visit
Admission: RE | Admit: 2024-09-05 | Discharge: 2024-09-05 | Disposition: A | Discharge status: Home / Self Care | Attending: Obstetrics and Gynecology | Admitting: Obstetrics and Gynecology

## 2024-09-05 DIAGNOSIS — D251 Intramural leiomyoma of uterus: Secondary | ICD-10-CM | POA: Diagnosis present

## 2024-09-05 DIAGNOSIS — Z87891 Personal history of nicotine dependence: Secondary | ICD-10-CM | POA: Diagnosis not present

## 2024-09-05 DIAGNOSIS — Z98891 History of uterine scar from previous surgery: Secondary | ICD-10-CM | POA: Diagnosis not present

## 2024-09-05 DIAGNOSIS — N83201 Unspecified ovarian cyst, right side: Secondary | ICD-10-CM | POA: Diagnosis not present

## 2024-09-05 DIAGNOSIS — N8003 Adenomyosis of the uterus: Secondary | ICD-10-CM | POA: Diagnosis not present

## 2024-09-05 DIAGNOSIS — E66813 Obesity, class 3: Secondary | ICD-10-CM | POA: Insufficient documentation

## 2024-09-05 DIAGNOSIS — N83202 Unspecified ovarian cyst, left side: Secondary | ICD-10-CM | POA: Diagnosis not present

## 2024-09-05 DIAGNOSIS — D219 Benign neoplasm of connective and other soft tissue, unspecified: Secondary | ICD-10-CM

## 2024-09-05 DIAGNOSIS — K219 Gastro-esophageal reflux disease without esophagitis: Secondary | ICD-10-CM | POA: Insufficient documentation

## 2024-09-05 DIAGNOSIS — N888 Other specified noninflammatory disorders of cervix uteri: Secondary | ICD-10-CM | POA: Insufficient documentation

## 2024-09-05 DIAGNOSIS — Z6841 Body Mass Index (BMI) 40.0 and over, adult: Secondary | ICD-10-CM | POA: Diagnosis not present

## 2024-09-05 DIAGNOSIS — Z8614 Personal history of Methicillin resistant Staphylococcus aureus infection: Secondary | ICD-10-CM | POA: Insufficient documentation

## 2024-09-05 DIAGNOSIS — Z01818 Encounter for other preprocedural examination: Secondary | ICD-10-CM

## 2024-09-05 DIAGNOSIS — N939 Abnormal uterine and vaginal bleeding, unspecified: Secondary | ICD-10-CM | POA: Insufficient documentation

## 2024-09-05 DIAGNOSIS — N736 Female pelvic peritoneal adhesions (postinfective): Secondary | ICD-10-CM | POA: Diagnosis not present

## 2024-09-05 DIAGNOSIS — D500 Iron deficiency anemia secondary to blood loss (chronic): Secondary | ICD-10-CM | POA: Insufficient documentation

## 2024-09-05 HISTORY — PX: CYSTOSCOPY: SHX5120

## 2024-09-05 HISTORY — PX: HYSTERECTOMY, TOTAL, LAPAROSCOPIC, ROBOT-ASSISTED WITH SALPINGECTOMY: SHX7587

## 2024-09-05 HISTORY — PX: ROBOTIC ASSISTED LAPAROSCOPIC LYSIS OF ADHESION: SHX6080

## 2024-09-05 HISTORY — PX: ROBOTIC ASSISTED LAPAROSCOPIC OVARIAN CYSTECTOMY: SHX6081

## 2024-09-05 LAB — POCT PREGNANCY, URINE: Preg Test, Ur: NEGATIVE

## 2024-09-05 SURGERY — HYSTERECTOMY, TOTAL, LAPAROSCOPIC, ROBOT-ASSISTED WITH SALPINGECTOMY
Anesthesia: General | Site: Uterus

## 2024-09-05 MED ORDER — SODIUM CHLORIDE (PF) 0.9 % IJ SOLN
PREFILLED_SYRINGE | INTRAVENOUS | Status: DC | PRN
  Administered 2024-09-05: 1 mL via INTRAMUSCULAR

## 2024-09-05 MED ORDER — SILVER NITRATE-POT NITRATE 75-25 % EX MISC
CUTANEOUS | Status: AC
  Filled 2024-09-05: qty 10

## 2024-09-05 MED ORDER — METHYLENE BLUE (ANTIDOTE) 1 % IV SOLN
INTRAVENOUS | Status: AC
  Filled 2024-09-05: qty 10

## 2024-09-05 MED ORDER — FENTANYL CITRATE (PF) 100 MCG/2ML IJ SOLN
25.0000 ug | INTRAMUSCULAR | Status: DC | PRN
  Administered 2024-09-05: 50 ug via INTRAVENOUS

## 2024-09-05 MED ORDER — BUPIVACAINE HCL 0.5 % IJ SOLN
INTRAMUSCULAR | Status: DC | PRN
  Administered 2024-09-05: 9 mL

## 2024-09-05 MED ORDER — FENTANYL CITRATE (PF) 100 MCG/2ML IJ SOLN
INTRAMUSCULAR | Status: AC
  Filled 2024-09-05: qty 2

## 2024-09-05 MED ORDER — ACETAMINOPHEN EXTRA STRENGTH 500 MG PO TABS: 1000.0000 mg | ORAL_TABLET | Freq: Four times a day (QID) | ORAL | 0 refills | Status: AC

## 2024-09-05 MED ORDER — DOCUSATE SODIUM 100 MG PO CAPS: 100.0000 mg | ORAL_CAPSULE | Freq: Two times a day (BID) | ORAL | 0 refills | Status: AC

## 2024-09-05 MED ORDER — PROPOFOL 1000 MG/100ML IV EMUL
INTRAVENOUS | Status: AC
  Filled 2024-09-05: qty 100

## 2024-09-05 MED ORDER — CELECOXIB 200 MG PO CAPS: 200.0000 mg | ORAL_CAPSULE | Freq: Two times a day (BID) | ORAL | 0 refills | Status: AC

## 2024-09-05 MED ORDER — LACTATED RINGERS IV SOLN: INTRAVENOUS | Status: DC

## 2024-09-05 MED ORDER — ONDANSETRON HCL 4 MG/2ML IJ SOLN
INTRAMUSCULAR | Status: AC
  Filled 2024-09-05: qty 2

## 2024-09-05 MED ORDER — MIDAZOLAM HCL (PF) 2 MG/2ML IJ SOLN
INTRAMUSCULAR | Status: DC | PRN
  Administered 2024-09-05: 2 mg via INTRAVENOUS

## 2024-09-05 MED ORDER — CEFAZOLIN SODIUM-DEXTROSE 2-4 GM/100ML-% IV SOLN
INTRAVENOUS | Status: AC
  Filled 2024-09-05: qty 100

## 2024-09-05 MED ORDER — BUPIVACAINE HCL (PF) 0.5 % IJ SOLN
INTRAMUSCULAR | Status: AC
  Filled 2024-09-05: qty 30

## 2024-09-05 MED ORDER — DEXAMETHASONE SOD PHOSPHATE PF 10 MG/ML IJ SOLN
INTRAMUSCULAR | Status: DC | PRN
  Administered 2024-09-05: 10 mg via INTRAVENOUS

## 2024-09-05 MED ORDER — SODIUM CHLORIDE 0.9 % IV SOLN: INTRAVENOUS | Status: DC | PRN

## 2024-09-05 MED ORDER — LIDOCAINE HCL (PF) 2 % IJ SOLN
INTRAMUSCULAR | Status: AC
  Filled 2024-09-05: qty 5

## 2024-09-05 MED ORDER — KETOROLAC TROMETHAMINE 30 MG/ML IJ SOLN
INTRAMUSCULAR | Status: DC | PRN
  Administered 2024-09-05: 30 mg via INTRAVENOUS

## 2024-09-05 MED ORDER — METRONIDAZOLE 500 MG/100ML IV SOLN
500.0000 mg | INTRAVENOUS | Status: AC
  Administered 2024-09-05: 500 mg via INTRAVENOUS
  Filled 2024-09-05: qty 100

## 2024-09-05 MED ORDER — OXYCODONE HCL 5 MG PO TABS
5.0000 mg | ORAL_TABLET | Freq: Once | ORAL | Status: AC
  Administered 2024-09-05: 5 mg via ORAL

## 2024-09-05 MED ORDER — DROPERIDOL 2.5 MG/ML IJ SOLN: 0.6250 mg | Freq: Once | INTRAMUSCULAR | Status: DC | PRN

## 2024-09-05 MED ORDER — 0.9 % SODIUM CHLORIDE (POUR BTL) OPTIME
TOPICAL | Status: DC | PRN
  Administered 2024-09-05: 500 mL

## 2024-09-05 MED ORDER — POVIDONE-IODINE 10 % EX SWAB: 2.0000 | Freq: Once | CUTANEOUS | Status: DC

## 2024-09-05 MED ORDER — PROPOFOL 10 MG/ML IV BOLUS
INTRAVENOUS | Status: DC | PRN
Start: 2024-09-05 — End: 2024-09-05
  Administered 2024-09-05: 125 ug/kg/min via INTRAVENOUS
  Administered 2024-09-05: 200 mg via INTRAVENOUS
  Administered 2024-09-05: 135 ug/kg/min via INTRAVENOUS
  Administered 2024-09-05: 115 ug/kg/min via INTRAVENOUS
  Administered 2024-09-05: 150 ug/kg/min via INTRAVENOUS
  Administered 2024-09-05: 125 ug/kg/min via INTRAVENOUS

## 2024-09-05 MED ORDER — SODIUM CHLORIDE 0.9 % IR SOLN
Status: DC | PRN
  Administered 2024-09-05: 1000 mL

## 2024-09-05 MED ORDER — FENTANYL CITRATE (PF) 100 MCG/2ML IJ SOLN
INTRAMUSCULAR | Status: DC | PRN
  Administered 2024-09-05 (×4): 50 ug via INTRAVENOUS

## 2024-09-05 MED ORDER — CEFAZOLIN SODIUM-DEXTROSE 2-4 GM/100ML-% IV SOLN
2.0000 g | INTRAVENOUS | Status: AC
  Administered 2024-09-05: 2 g via INTRAVENOUS

## 2024-09-05 MED ORDER — ROCURONIUM BROMIDE 10 MG/ML (PF) SYRINGE
PREFILLED_SYRINGE | INTRAVENOUS | Status: AC
  Filled 2024-09-05: qty 10

## 2024-09-05 MED ORDER — ROCURONIUM BROMIDE 100 MG/10ML IV SOLN
INTRAVENOUS | Status: DC | PRN
  Administered 2024-09-05: 20 mg via INTRAVENOUS
  Administered 2024-09-05 (×4): 50 mg via INTRAVENOUS

## 2024-09-05 MED ORDER — BUPIVACAINE HCL (PF) 0.5 % IJ SOLN
INTRAMUSCULAR | Status: DC | PRN
Start: 2024-09-05 — End: 2024-09-05

## 2024-09-05 MED ORDER — OXYCODONE HCL 5 MG PO TABS: 5.0000 mg | ORAL_TABLET | ORAL | 0 refills | Status: AC | PRN

## 2024-09-05 MED ORDER — SUGAMMADEX SODIUM 200 MG/2ML IV SOLN
INTRAVENOUS | Status: DC | PRN
  Administered 2024-09-05: 200 mg via INTRAVENOUS

## 2024-09-05 MED ORDER — ACETAMINOPHEN 500 MG PO TABS
1000.0000 mg | ORAL_TABLET | ORAL | Status: AC
  Administered 2024-09-05: 1000 mg via ORAL

## 2024-09-05 MED ORDER — LIDOCAINE HCL (CARDIAC) PF 100 MG/5ML IV SOSY
PREFILLED_SYRINGE | INTRAVENOUS | Status: DC | PRN
  Administered 2024-09-05: 100 mg via INTRAVENOUS

## 2024-09-05 MED ORDER — ONDANSETRON HCL 4 MG/2ML IJ SOLN
INTRAMUSCULAR | Status: DC | PRN
Start: 2024-09-05 — End: 2024-09-05
  Administered 2024-09-05: 4 mg via INTRAVENOUS

## 2024-09-05 MED ORDER — ORAL CARE MOUTH RINSE: 15.0000 mL | Freq: Once | OROMUCOSAL | Status: AC

## 2024-09-05 MED ORDER — ACETAMINOPHEN 500 MG PO TABS
ORAL_TABLET | ORAL | Status: AC
  Filled 2024-09-05: qty 2

## 2024-09-05 MED ORDER — CHLORHEXIDINE GLUCONATE 0.12 % MT SOLN
15.0000 mL | Freq: Once | OROMUCOSAL | Status: AC
  Administered 2024-09-05: 15 mL via OROMUCOSAL

## 2024-09-05 MED ORDER — GABAPENTIN 300 MG PO CAPS: 300.0000 mg | ORAL_CAPSULE | Freq: Every day | ORAL | 0 refills | Status: AC

## 2024-09-05 MED ORDER — CHLORHEXIDINE GLUCONATE 0.12 % MT SOLN
OROMUCOSAL | Status: AC
  Filled 2024-09-05: qty 15

## 2024-09-05 MED ORDER — OXYCODONE HCL 5 MG PO TABS
ORAL_TABLET | ORAL | Status: AC
  Filled 2024-09-05: qty 1

## 2024-09-05 MED ORDER — MIDAZOLAM HCL 2 MG/2ML IJ SOLN
INTRAMUSCULAR | Status: AC
  Filled 2024-09-05: qty 2

## 2024-09-05 SURGICAL SUPPLY — 70 items
BAG URINE DRAIN 2000ML AR STRL (UROLOGICAL SUPPLIES) ×4 IMPLANT
BLADE SURG SZ11 CARB STEEL (BLADE) ×4 IMPLANT
CANNULA CAP OBTURATR AIRSEAL 8 (CAP) IMPLANT
CATH ROBINSON RED A/P 16FR (CATHETERS) ×4 IMPLANT
CATH URTH 16FR FL 2W BLN LF (CATHETERS) ×4 IMPLANT
COVER TIP SHEARS 8 DVNC (MISCELLANEOUS) ×4 IMPLANT
COVER WAND RF STERILE (DRAPES) ×4 IMPLANT
DEFOGGER SCOPE WARM SEASHARP (MISCELLANEOUS) ×4 IMPLANT
DERMABOND ADVANCED .7 DNX12 (GAUZE/BANDAGES/DRESSINGS) ×4 IMPLANT
DRAPE ARM DVNC X/XI (DISPOSABLE) ×12 IMPLANT
DRAPE COLUMN DVNC XI (DISPOSABLE) ×4 IMPLANT
DRSG TEGADERM 2-3/8X2-3/4 SM (GAUZE/BANDAGES/DRESSINGS) ×12 IMPLANT
ELECTRODE REM PT RTRN 9FT ADLT (ELECTROSURGICAL) ×4 IMPLANT
FORCEPS BPLR FENES DVNC XI (FORCEP) ×4 IMPLANT
FORCEPS BPLR LNG DVNC XI (INSTRUMENTS) IMPLANT
GAUZE 4X4 16PLY ~~LOC~~+RFID DBL (SPONGE) ×8 IMPLANT
GLOVE BIO SURGEON STRL SZ7 (GLOVE) ×16 IMPLANT
GLOVE INDICATOR 7.5 STRL GRN (GLOVE) ×16 IMPLANT
GOWN STRL REUS W/ TWL LRG LVL3 (GOWN DISPOSABLE) ×16 IMPLANT
GRASPER SUT TROCAR 14GX15 (MISCELLANEOUS) ×4 IMPLANT
IRRIGATION STRYKERFLOW (MISCELLANEOUS) IMPLANT
IV 0.9% NACL 1000 ML (IV SOLUTION) IMPLANT
IV LR 1000 ML (IV SOLUTION) ×4 IMPLANT
KIT PINK PAD W/HEAD ARM REST (MISCELLANEOUS) ×4 IMPLANT
LABEL OR SOLS (LABEL) ×4 IMPLANT
LIGASURE VESSEL 5MM BLUNT TIP (ELECTROSURGICAL) IMPLANT
MANIFOLD NEPTUNE II (INSTRUMENTS) ×4 IMPLANT
MANIPULATOR UTERINE 4.5 ZUMI (MISCELLANEOUS) ×4 IMPLANT
MANIPULATOR VCARE LG CRV RETR (MISCELLANEOUS) IMPLANT
NDL DRIVE SUT CUT DVNC (INSTRUMENTS) ×4 IMPLANT
NEEDLE DRIVE SUT CUT DVNC (INSTRUMENTS) ×4 IMPLANT
NS IRRIG 500ML POUR BTL (IV SOLUTION) ×4 IMPLANT
OBTURATOR OPTICALSTD 8 DVNC (TROCAR) ×4 IMPLANT
OCCLUDER COLPOPNEUMO (BALLOONS) IMPLANT
PACK GYN LAPAROSCOPIC (MISCELLANEOUS) ×4 IMPLANT
PAD OB MATERNITY 11 LF (PERSONAL CARE ITEMS) ×4 IMPLANT
PAD PREP OB/GYN DISP 24X41 (PERSONAL CARE ITEMS) ×4 IMPLANT
POWDER SURGICEL 3.0 GRAM (HEMOSTASIS) IMPLANT
RETRACTOR RING XSMALL (MISCELLANEOUS) IMPLANT
SCISSORS MNPLR CVD DVNC XI (INSTRUMENTS) ×4 IMPLANT
SCRUB CHG 4% DYNA-HEX 4OZ (MISCELLANEOUS) ×4 IMPLANT
SEAL UNIV 5-12 XI (MISCELLANEOUS) ×12 IMPLANT
SEALER VESSEL EXT DVNC XI (MISCELLANEOUS) IMPLANT
SET CYSTO IRRIGATION (SET/KITS/TRAYS/PACK) ×4 IMPLANT
SET TUBE FILTERED XL AIRSEAL (SET/KITS/TRAYS/PACK) IMPLANT
SET TUBE SMOKE EVAC HIGH FLOW (TUBING) ×4 IMPLANT
SHEARS HARMONIC 36 ACE (MISCELLANEOUS) IMPLANT
SOLN 0.9% NACL POUR BTL 1000ML (IV SOLUTION) ×4 IMPLANT
SOLN 0.9% NACL POUR BTL 500 ML (IV SOLUTION) IMPLANT
SOLUTION ELECTROSURG ANTI STCK (MISCELLANEOUS) ×4 IMPLANT
SOLUTION PREP PVP 2OZ (MISCELLANEOUS) ×4 IMPLANT
SOLUTION SCRB POV-IOD 4OZ 7.5% (MISCELLANEOUS) ×4 IMPLANT
STRIP CLOSURE SKIN 1/2X4 (GAUZE/BANDAGES/DRESSINGS) ×4 IMPLANT
SURGILUBE 2OZ TUBE FLIPTOP (MISCELLANEOUS) ×4 IMPLANT
SUT STRATA 2-0 30 CT-2 (SUTURE) IMPLANT
SUT STRATA PDS 0 30 CT-2.5 (SUTURE) IMPLANT
SUT VIC AB 0 CT1 27XCR 8 STRN (SUTURE) ×4 IMPLANT
SUT VIC AB 0 CT2 27 (SUTURE) ×8 IMPLANT
SUT VIC AB 2-0 UR6 27 (SUTURE) ×4 IMPLANT
SUT VIC AB 4-0 SH 27XANBCTRL (SUTURE) ×8 IMPLANT
SUTURE MNCRL 4-0 27XMF (SUTURE) ×4 IMPLANT
SUTURE STRAT PDS 2-0 23 CT-2.5 (SUTURE) ×4 IMPLANT
SYR 10ML LL (SYRINGE) ×4 IMPLANT
SYR 50ML LL SCALE MARK (SYRINGE) ×4 IMPLANT
SYRINGE TOOMEY IRRIG 70ML (MISCELLANEOUS) IMPLANT
TIP ENDOSCOPIC SURGICEL (TIP) IMPLANT
TRAP FLUID SMOKE EVACUATOR (MISCELLANEOUS) ×4 IMPLANT
TROCAR Z-THRD FIOS HNDL 11X100 (TROCAR) ×4 IMPLANT
TUBING EVAC SMOKE HEATED PNEUM (TUBING) ×4 IMPLANT
WATER STERILE IRR 500ML POUR (IV SOLUTION) ×4 IMPLANT

## 2024-09-05 NOTE — Interval H&P Note (Signed)
 History and Physical Interval Note:  09/05/2024 7:37 AM  Amy Maxwell  has presented today for surgery, with the diagnosis of AUB-F.  The various methods of treatment have been discussed with the patient and family. After consideration of risks, benefits and other options for treatment, the patient has consented to  Procedure(s): HYSTERECTOMY, TOTAL, LAPAROSCOPIC, ROBOT-ASSISTED WITH SALPINGECTOMY (Bilateral) CYSTOSCOPY (N/A) LYSIS, ADHESIONS, ROBOT-ASSISTED, LAPAROSCOPIC (N/A) EXCISION, CYST, OVARY, ROBOT-ASSISTED, LAPAROSCOPIC (Left) as a surgical intervention.  The patient's history has been reviewed, patient examined, no change in status, stable for surgery.  I have reviewed the patient's chart and labs.  Questions were answered to the patient's satisfaction.     Heather Penton

## 2024-09-05 NOTE — Anesthesia Preprocedure Evaluation (Signed)
 Anesthesia Evaluation  Patient identified by MRN, date of birth, ID band Patient awake    Reviewed: Allergy & Precautions, H&P , NPO status , Patient's Chart, lab work & pertinent test results, reviewed documented beta blocker date and time   History of Anesthesia Complications (+) PONV and history of anesthetic complications  Airway Mallampati: III  TM Distance: >3 FB Neck ROM: full    Dental  (+) Dental Advidsory Given, Teeth Intact   Pulmonary neg pulmonary ROS, former smoker   Pulmonary exam normal breath sounds clear to auscultation       Cardiovascular Exercise Tolerance: Good negative cardio ROS Normal cardiovascular exam Rhythm:regular Rate:Normal     Neuro/Psych  PSYCHIATRIC DISORDERS Anxiety     negative neurological ROS     GI/Hepatic Neg liver ROS,GERD  ,,  Endo/Other  neg diabetes  Class 3 obesity  Renal/GU negative Renal ROS  negative genitourinary   Musculoskeletal   Abdominal   Peds  Hematology  (+) Blood dyscrasia, anemia   Anesthesia Other Findings Past Medical History: No date: Anemia No date: GERD (gastroesophageal reflux disease) No date: Hepatomegaly No date: History of blood transfusion No date: History of COVID-19 2012: History of methicillin resistant staphylococcus aureus (MRSA) No date: Lumbar spondylosis No date: PCOS (polycystic ovarian syndrome) No date: PONV (postoperative nausea and vomiting) 2022: PVT (portal vein thrombosis)     Comment:  saw hematology Linnea) and hypercoag w/u negative No date: Tobacco use disorder 03/25/2024: UTI (urinary tract infection)   Reproductive/Obstetrics negative OB ROS                              Anesthesia Physical Anesthesia Plan  ASA: 3  Anesthesia Plan: General   Post-op Pain Management:    Induction: Intravenous  PONV Risk Score and Plan: 4 or greater and Ondansetron , Dexamethasone , Propofol  infusion,  TIVA, Midazolam  and Treatment may vary due to age or medical condition  Airway Management Planned: Oral ETT  Additional Equipment:   Intra-op Plan:   Post-operative Plan: Extubation in OR  Informed Consent: I have reviewed the patients History and Physical, chart, labs and discussed the procedure including the risks, benefits and alternatives for the proposed anesthesia with the patient or authorized representative who has indicated his/her understanding and acceptance.     Dental Advisory Given  Plan Discussed with: Anesthesiologist, CRNA and Surgeon  Anesthesia Plan Comments:          Anesthesia Quick Evaluation

## 2024-09-05 NOTE — Transfer of Care (Signed)
 Immediate Anesthesia Transfer of Care Note  Patient: Amy Maxwell  Procedure(s) Performed: HYSTERECTOMY, TOTAL, LAPAROSCOPIC, ROBOT-ASSISTED WITH SALPINGECTOMY (Bilateral: Uterus) CYSTOSCOPY (Bladder) LYSIS, ADHESIONS, ROBOT-ASSISTED, LAPAROSCOPIC (Pelvis) EXCISION, CYST, OVARY, ROBOT-ASSISTED, LAPAROSCOPIC (Bilateral: Pelvis)  Patient Location: PACU  Anesthesia Type:General  Level of Consciousness: awake  Airway & Oxygen Therapy: Patient Spontanous Breathing  Post-op Assessment: Report given to RN and Post -op Vital signs reviewed and stable  Post vital signs: Reviewed and stable  Last Vitals:  Vitals Value Taken Time  BP 129/82 09/05/24 12:07  Temp    Pulse 98 09/05/24 12:12  Resp 21 09/05/24 12:12  SpO2 100 % 09/05/24 12:12  Vitals shown include unfiled device data.  Last Pain:  Vitals:   09/05/24 0653  PainSc: 4          Complications: There were no known notable events for this encounter.

## 2024-09-05 NOTE — Anesthesia Procedure Notes (Signed)
 Procedure Name: Intubation Date/Time: 09/05/2024 7:45 AM  Performed by: Bonnetta Jimmey SAUNDERS, CRNAPre-anesthesia Checklist: Patient identified, Emergency Drugs available, Suction available and Patient being monitored Patient Re-evaluated:Patient Re-evaluated prior to induction Oxygen Delivery Method: Circle system utilized Preoxygenation: Pre-oxygenation with 100% oxygen Induction Type: IV induction Ventilation: Mask ventilation without difficulty Laryngoscope Size: McGrath and 3 Grade View: Grade I Tube type: Oral Tube size: 6.5 mm Number of attempts: 1 Airway Equipment and Method: Stylet and Oral airway Placement Confirmation: ETT inserted through vocal cords under direct vision, positive ETCO2 and breath sounds checked- equal and bilateral Secured at: 20 cm Tube secured with: Tape Dental Injury: Teeth and Oropharynx as per pre-operative assessment

## 2024-09-05 NOTE — Discharge Instructions (Signed)
 Discharge instructions after  robotically-assisted total laparoscopic hysterectomy   For the next three days, take ibuprofen and acetaminophen  on a schedule, every 8 hours. You can take them together or you can intersperse them, and take one every four hours. I also gave you gabapentin  for nighttime, to help you sleep and also to control pain. Take gabapentin  medicines at night for at least the next 3 nights. You also have a narcotic, oxycodone , to take as needed if the above medicines don't help.  Postop constipation is a major cause of pain. Stay well hydrated, walk as you tolerate, and take over the counter senna as well as stool softeners if you need them.   Signs and Symptoms to Report Call our office at [phone] if you have any of the following.   Fever over 100.4 degrees or higher  Severe stomach pain not relieved with pain medications  Bright red bleeding that's heavier than a period that does not slow with rest  To go the bathroom a lot (frequency), you can't hold your urine (urgency), or it hurts when you empty your bladder (urinate)  Chest pain  Shortness of breath  Pain in the calves of your legs  Severe nausea and vomiting not relieved with anti-nausea medications  Signs of infection around your wounds, such as redness, hot to touch, swelling, green/yellow drainage (like pus), bad smelling discharge  Any concerns  What You Can Expect after Surgery  You may see some pink tinged, bloody fluid and bruising around the wound. This is normal.  You may notice shoulder and neck pain. This is caused by the gas used during surgery to expand your abdomen so your surgeon could get to the uterus easier.  You may have a sore throat because of the tube in your mouth during general anesthesia. This will go away in 2 to 3 days.  You may have some stomach cramps.  You may notice spotting on your panties.  You may have pain around the incision sites.   Activities after Your  Discharge Follow these guidelines to help speed your recovery at home:  Do the coughing and deep breathing as you did in the hospital for 2 weeks. Use the small blue breathing device, called the incentive spirometer for 2 weeks.  Don't drive if you are in pain or taking narcotic pain medicine. You may drive when you can safely slam on the brakes, turn the wheel forcefully, and rotate your torso comfortably. This is typically 1-2 weeks. Practice in a parking lot or side street prior to attempting to drive regularly.   Ask others to help with household chores for 4 weeks.  Do not lift anything heavier that 10 pounds for 4-6 weeks. This includes pets, children, and groceries.  Don't do strenuous activities, exercises, or sports like vacuuming, tennis, squash, etc. until your doctor says it is safe to do so. ---Maintain pelvic rest for 12 weeks. This means nothing in the vagina or rectum at all (no douching, tampons, intercourse) for 12 weeks.   Walk as you feel able. Rest often since it may take two or three weeks for your energy level to return to normal.   You may climb stairs  Avoid constipation:   -Eat fruits, vegetables, and whole grains. Eat small meals as your appetite will take time to return to normal.   -Drink 6 to 8 glasses of water each day unless your doctor has told you to limit your fluids.   -Use a laxative or  stool softener as needed if constipation becomes a problem. You may take Miralax, metamucil, Citrucil, Colace, Senekot, FiberCon, etc. If this does not relieve the constipation, try two tablespoons of Milk Of Magnesia every 8 hours until your bowels move.   You may shower. Gently wash the wounds with a mild soap and water. Pat dry.  Do not get in a hot tub, swimming pool, etc. for 6 weeks.  Do not use lotions, oils, powders on the wounds.  Do not douche, use tampons, or have sex until your doctor says it is okay.  Take your pain medicine when you need it. The medicine may not  work as well if the pain is bad.  Take the medicines you were taking before surgery.    Here is a helpful article from the website http://mitchell.org/, regarding constipation  Here are reasons why constipation occurs after surgery: 1) During the operation and in the recovery room, most people are given opioid pain medication, primarily through an IV, to treat moderate or severe pain. Intravenous opioids include morphine, Dilaudid and fentanyl . After surgery, patients are often prescribed opioid pain medication to take by mouth at home, including codeine, Vicodin, Norco, and Percocet. All of these medications cause constipation by slowing down the movement of your intestine. 2) Changes in your diet before surgery can be another culprit. It is common to get specific instructions to change how you normally eat or drink before your surgery, like only having liquids the day before or not having anything to eat or drink after midnight the night before surgery. For this reason, temporary dehydration may occur. This, along with not eating or only having liquids, means that you are getting less fiber than usual. Both these factors contribute to constipation. 3) Changes in your diet after surgery can also contribute to the problem. Although many people don't have dietary restrictions after operations, being under anesthesia can make you lose your appetite for several hours and maybe even days. Some people can even have nausea or vomiting. Not eating or drinking normally means that you are not getting enough fiber and you can get dehydrated, both leading to constipation. 4) Lying in a bed more than usual--which happens before, during and after surgery--combined with the medications and diet changes, all work together to slow down your colon and make your poop turn to rock.  No one likes to be constipated.  Let's face it, it's not a pleasant feeling when you don't poop for days, then strain on the toilet to finally  pass something large enough to cause damage. An ounce of prevention is worth a pound of cure, so: Assume you will be constipated. Plan and prepare accordingly. Post-surgery is one of those unique situations where the temporary use of laxatives can make a world of difference. Always consult with your doctor, and recognize that if you wait several days after surgery to take a laxative, the constipation might be too severe for these over-the-counter options. It is always important to discuss all medications you plan on taking with your doctor. Ask your doctor if you can start the laxative immediately after surgery. *  Here are go-to post-surgery laxatives: Senna: Senna is an herb that acts as a "stimulant laxative," meaning it increases the activity of the intestine to cause you to have a bowel movement. It comes in many forms, but senna pills are easy to take and are sold over the counter at almost all pharmacies. Since opioid pain medications slow down the activity of  the intestine, it makes sense to take a medication to help reverse that side effect. Long-term use of a stimulant laxative is not a good idea since it can make your colon "lazy" and not function properly; however, temporary use immediately after surgery is acceptable. In general, if you are able to eat a normal diet, taking senna soon after surgery works the best. Senna usually works within hours to produce a bowel movement, but this is less predictable when you are taking different medications after surgery. Try not to wait several days to start taking senna, as often it is too late by then. Just like with all medications or supplements, check with your doctor before starting new treatment.   Magnesium: Magnesium is an important mineral that our body needs. We get magnesium from some foods that we eat, especially foods that are high in fiber such as broccoli, almonds and whole grains. There are also magnesium-based medications used to treat  constipation including milk of magnesia (magnesium hydroxide), magnesium citrate and magnesium oxide. They work by drawing water into the intestine, putting it into the class of "osmotic" laxatives. Magnesium products in low doses appear to be safe, but if taken in very large doses, can lead to problems such as irregular heartbeat, low blood pressure and even death. It can also affect other medications you might be taking, therefore it is important to discuss using magnesium with your physician and pharmacist before initiating therapy. Most over-the-counter magnesium laxatives work very well to help with the constipation related to surgery, but sometimes they work too well and lead to diarrhea. Make sure you are somewhere with easy access to a bathroom, just in case.   Bisacodyl: Bisacodyl (generic name) is sold under brand names such as Dulcolax. Much like senna, it is a "stimulant laxative," meaning it makes your intestines move more quickly to push out the stool. This is another good choice to start taking as soon as your doctor says you can take a laxative after surgery. It comes in pill form and as a suppository, which is a good choice for people who cannot or are not allowed to swallow pills. Studies have shown that it works as a laxative, but like most of these medications, you should use this on a short-term basis only.   Enema: Enemas strike fear in many people, but FEAR NOT! It's nowhere near as big a deal as you may think. An enema is just a way to get some liquid into your rectum by placing a specially designed device through your anus. If you have never done one, it might seem like a painful, unpleasant, uncomfortable, complicated and lengthy procedure. But in reality, it's simple, takes just a few seconds and is highly effective. The small ready-made bottles you buy at the pharmacy are much easier than the hose/large rubber container type. Those recommended positions illustrated in some  instructions are generally not necessary to place the enema. It's very similar to the insertion of a tampon, requiring a slight squat. Some extra lubrication on the enema's tip (or on your anus) will make it a breeze. In certain cases, there is no substitute for a good enema. For example, if someone has not pooped for a few days, the beginning of the poop waiting to come out can become rock hard. Passing that hard stool can lead to much pain and problems like anal fissures. Inserting a little liquid to break up the rock-hard stool will help make its passage much easier. Enemas come  with different liquids. Most come with saline, but there are also mineral oil options. You can also use warm water in the reusable enema containers. They all work. But since saline can sometimes be irritating, so try a mineral oil or water enema instead.  Here are commonly recommended constipation medications that do not work well for post-surgery constipation: Docusate: Docusate (generic name) most commonly referred to as Colace (brand name) is not really a laxative, but is classified as a stool softener. Although this medication is commonly prescribed, it is not recommended for several reasons: 1) there is no good medical evidence that it works 2) even if it has an effect, which is very questionable, it is minimal and cannot combat the intestinal slowing caused by the opioid medications. Skip docusate to save money and space in your pillbox for something more effective.  PEG: Miralax (brand name) is basically a chemical called polyethylene glycol (PEG) and it has gained tremendous popularity as a laxative. This product is an "osmotic laxative" meaning it works by pulling water into the stool, making it softer. This is very similar to the action of natural fiber in foods and supplements. Therefore, the effect seen by this medication is not immediate, causing a bowel movement in a day or more. Is this medication strong enough to  battle the constipation related to having an operation? Maybe for some people not prone to constipation. But for most people, other laxatives are better to prevent constipation after surgery.

## 2024-09-05 NOTE — Op Note (Signed)
 Amy Maxwell PROCEDURE DATE: 09/05/2024  PREOPERATIVE DIAGNOSIS: Fibroid uterus symptomatic anemia abnormal uterine bleeding POSTOPERATIVE DIAGNOSIS: The same plus severe pelvic adhesive disease PROCEDURE:  HYSTERECTOMY, TOTAL, LAPAROSCOPIC, ROBOT-ASSISTED WITH SALPINGECTOMY: 58571 (CPT)  CYSTOSCOPY: 52000 (CPT)  LYSIS, ADHESIONS, ROBOT-ASSISTED, LAPAROSCOPIC: 41339 (CPT)  EXCISION, CYST, OVARY, ROBOT-ASSISTED, LAPAROSCOPIC: 41337 (CPT)   SURGEON:  Dr. Heather Penton, MD ASSISTANT: Dr. Garnette Mace Anesthesiologist:  Anesthesiologist: Dario Barter, MD CRNA: Jaylene Nest, CRNA; Hayes, Crysta, CRNA; Strickland, Bevin R, CRNA  An experienced assistant was required given the standard of surgical care given the complexity of the case.  This assistant was needed for exposure, dissection, suctioning, retraction, instrument exchange: Dr. Mace provided expert assistance required for safety during the procedure.   INDICATIONS: 43 y.o. F  here for definitive surgical management secondary to the indications listed under preoperative diagnoses; please see preoperative note for further details.  Risks of surgery were discussed with the patient including but not limited to: bleeding which may require transfusion or reoperation; infection which may require antibiotics; injury to bowel, bladder, ureters or other surrounding organs; need for additional procedures; thromboembolic phenomenon, incisional problems and other postoperative/anesthesia complications. Written informed consent was obtained.    FINDINGS:    External genitalia, vaginal canal and cervix negative for lesions. Intraoperative findings revealed a normal upper abdomen including bowel, liver, diaphragmatic surfaces, stomach, and omentum.  However, the omentum was adherent to the anterior abdominal wall just lateral left of the umbilicus.  There was significant scar tissue was in the pelvis between the anterior uterus and  the posterior abdominal wall.  The uterus was large and boggy, with intramural fibroids and globular appearing, consistent with adenomyosis.  Because the patient had a history of 3 C-sections with ventral hernia mesh placement on the right C-section ankle with significant scarring and a postoperative MRSA infection, careful attention was paid to the bladder.  The bladder was not visible behind the scarring, so the bladder bladder was backfilled with methylene blue and no damage occurred at the bladder.  Bilateral ovaries with hemorrhagic cyst.  Both cysts were removed with their cyst walls.  Bilateral tubes appeared normal.  Appendix not visualized   ANESTHESIA:    General INTRAVENOUS FLUIDS:1200  ml ESTIMATED BLOOD LOSS: 30 ml URINE OUTPUT: 300 ml  SPECIMENS: Uterus, cervix, bilateral fallopian tubes and bilateral ovarian cysts COMPLICATIONS: None immediate Modifier 22.  More than 75% of this case was lysis of adhesions from the bladder and anterior abdominal wall and omentum.  RATLH/BS:  PROCEDURE IN DETAIL: After informed consent was obtained, the patient was taken to the operating room where general anesthesia was obtained without difficulty. The patient was positioned in the dorsal lithotomy position in Beverly stirrups and her arms were carefully tucked at her sides and the usual precautions were taken. Deep Trendelenburg (20-25 deg) was established to confirm that she does not shift on the table.  She was prepped and draped in normal sterile fashion.  Time-out was performed and a Foley catheter was placed into the bladder.  Uterine manipulator was not able to be placed at this time due to the severe anterior version of the uterus from the cervix all the way through the anterior uterus.     Preoperative prophylactic antibiotics were given through her iv.  After infiltration of local anesthetic at the proposed trocar sites, an 8 mm incision was created at the umbilicus, and an AirSeal  5mm was placed under direct visualization, after confirmation of OG tube working well. Pneumoperitoneum was  created to a pressure of 15 mm Hg. The camera was placed and the abdomin surveyed, noting intact bowel below the site of entry. A survey of the pelvis and upper abdomen revealed the above findings. One right and one left lateral 8-mm robotic ports were placed under direct visualization.  Due to the adhesions, a second right lateral 8 mm robotic port was placed giving a total of 4 arms.  The patient was placed in deepTrendelenburg and the bowel was displaced up into the upper abdomen. The robot was left side docked. The instruments were placed under direct visualization.   Omental adhesions were removed with cautery and sharply from the anterior abdominal until the uterus and scarring was able to be visualized.  Once uterus was visible, a vaginal EEA sizer was placed against the cervix and the bladder was backfilled with 270 mL of methylene blue blue dyed saline.  Using care and cautery, the scarring was gently taken down starting from the lateral superior edges, being careful to avoid area of bladder adhesions.  This portion of the case was the majority of the case.  The broad ligament bilaterally was able to be visualized after removal of left-sided adhesions, and the anterior and posterior leaflets were taken down carefully to the area of the cervix.  The uterine arteries at this site were carefully skeletonized and left intact while the bladder adhesions were still obscuring the normal anatomy.  The ureters were identified bilaterally coursing outside of the operative field.   The Fallopian tubes were divided from the ovaries, and care taken to hemostatically transect the utero-ovarian ligament.   Attention was returned anteriorly and scarring was taken down all the way to the area of the anterior cervix.  At this point, a Vcare uterine manipulator was able to be placed, which helped  significantly with the posterior anatomy.  With strong cephalad pressure from the V-care, bipolar cautery was used to seal and transect the uterine arteries, and the pedicles allowed to fall away laterally.  A colpotomy was performed circumferentially along the V-Care ring with monopolar electrocautery and the cervix was incised from the vagina using the laparoscopic scissors. The specimen was removed through the vagina.  A pneumo balloon was placed in the vagina and the vaginal cuff was then closed in a running continuous fashion using the  0 V-Lock suture with careful attention to include the vaginal cuff angles, the uterosacral ligaments and the vaginal mucosa within the closure.  This was done in 3 layers to be certain that hemostasis was secured on all surgical areas were addressed.  Hemostasis was secured with intraabdominal pressure and review of all surgical sites. The intraperitoneal pressure was dropped, and all planes of dissection, vascular pedicles and the vaginal cuff were found to be hemostatic.  The patient was placed in reverse Trendelenburg and her pelvis was irrigated completely.  No bleeding was noted, and there were no complications.  The robot was undocked.   Attention was turned to the bladder and cystoscopy showed vigorous bilateral ureteral jets.  No stitches were visualized in the bladder during cystoscopy.  The lateral trocars were removed under visualization.  The CO2 gas was released and several deep breaths given to remove any remaining CO2 from the peritoneal cavity.  The skin incisions were closed with 4-0 Monocryl subcuticular stitch and Dermabond.    Anesthesia was reversed without difficulty.  The patient tolerated the procedure well.  Sponge, lap and needle counts were correct x2.  The patient was  taken to recovery room in excellent condition.

## 2024-09-06 ENCOUNTER — Encounter: Payer: Self-pay | Admitting: Obstetrics and Gynecology

## 2024-09-07 LAB — SURGICAL PATHOLOGY

## 2024-09-08 NOTE — Anesthesia Postprocedure Evaluation (Signed)
 Anesthesia Post Note  Patient: Deysha Cartier  Procedure(s) Performed: HYSTERECTOMY, TOTAL, LAPAROSCOPIC, ROBOT-ASSISTED WITH SALPINGECTOMY (Bilateral: Uterus) CYSTOSCOPY (Bladder) LYSIS, ADHESIONS, ROBOT-ASSISTED, LAPAROSCOPIC (Pelvis) EXCISION, CYST, OVARY, ROBOT-ASSISTED, LAPAROSCOPIC (Bilateral: Pelvis)  Patient location during evaluation: PACU Anesthesia Type: General Level of consciousness: awake and alert Pain management: pain level controlled Vital Signs Assessment: post-procedure vital signs reviewed and stable Respiratory status: spontaneous breathing, nonlabored ventilation, respiratory function stable and patient connected to nasal cannula oxygen Cardiovascular status: blood pressure returned to baseline and stable Postop Assessment: no apparent nausea or vomiting Anesthetic complications: no   There were no known notable events for this encounter.   Last Vitals:  Vitals:   09/05/24 1315 09/05/24 1335  BP: 123/80 (!) 138/94  Pulse: 94 98  Resp: 16 20  Temp: (!) 36.2 C 36.7 C  SpO2: 98% 98%    Last Pain:  Vitals:   09/05/24 1335  TempSrc: Temporal  PainSc: 4                  Prentice Murphy

## 2024-09-19 ENCOUNTER — Inpatient Hospital Stay: Admitting: Oncology

## 2024-09-19 ENCOUNTER — Inpatient Hospital Stay: Attending: Oncology

## 2024-09-19 ENCOUNTER — Encounter: Payer: Self-pay | Admitting: Oncology

## 2024-09-19 VITALS — BP 109/85 | HR 81 | Temp 97.7°F | Resp 18 | Ht 64.0 in | Wt 245.4 lb

## 2024-09-19 DIAGNOSIS — Z79899 Other long term (current) drug therapy: Secondary | ICD-10-CM | POA: Insufficient documentation

## 2024-09-19 DIAGNOSIS — Z8379 Family history of other diseases of the digestive system: Secondary | ICD-10-CM | POA: Diagnosis not present

## 2024-09-19 DIAGNOSIS — Z87891 Personal history of nicotine dependence: Secondary | ICD-10-CM | POA: Insufficient documentation

## 2024-09-19 DIAGNOSIS — Z8616 Personal history of COVID-19: Secondary | ICD-10-CM | POA: Diagnosis not present

## 2024-09-19 DIAGNOSIS — D509 Iron deficiency anemia, unspecified: Secondary | ICD-10-CM

## 2024-09-19 DIAGNOSIS — F1290 Cannabis use, unspecified, uncomplicated: Secondary | ICD-10-CM | POA: Insufficient documentation

## 2024-09-19 DIAGNOSIS — Z811 Family history of alcohol abuse and dependence: Secondary | ICD-10-CM | POA: Insufficient documentation

## 2024-09-19 DIAGNOSIS — D6851 Activated protein C resistance: Secondary | ICD-10-CM | POA: Diagnosis not present

## 2024-09-19 DIAGNOSIS — D508 Other iron deficiency anemias: Secondary | ICD-10-CM | POA: Diagnosis present

## 2024-09-19 DIAGNOSIS — Z9049 Acquired absence of other specified parts of digestive tract: Secondary | ICD-10-CM | POA: Diagnosis not present

## 2024-09-19 DIAGNOSIS — Z9079 Acquired absence of other genital organ(s): Secondary | ICD-10-CM | POA: Diagnosis not present

## 2024-09-19 DIAGNOSIS — Z90710 Acquired absence of both cervix and uterus: Secondary | ICD-10-CM | POA: Insufficient documentation

## 2024-09-19 DIAGNOSIS — D75839 Thrombocytosis, unspecified: Secondary | ICD-10-CM | POA: Diagnosis not present

## 2024-09-19 DIAGNOSIS — I81 Portal vein thrombosis: Secondary | ICD-10-CM

## 2024-09-19 DIAGNOSIS — Z8614 Personal history of Methicillin resistant Staphylococcus aureus infection: Secondary | ICD-10-CM | POA: Insufficient documentation

## 2024-09-19 LAB — FERRITIN: Ferritin: 257 ng/mL (ref 11–307)

## 2024-09-19 LAB — CBC (CANCER CENTER ONLY)
HCT: 38.1 % (ref 36.0–46.0)
Hemoglobin: 13.1 g/dL (ref 12.0–15.0)
MCH: 30.5 pg (ref 26.0–34.0)
MCHC: 34.4 g/dL (ref 30.0–36.0)
MCV: 88.8 fL (ref 80.0–100.0)
Platelet Count: 450 K/uL — ABNORMAL HIGH (ref 150–400)
RBC: 4.29 MIL/uL (ref 3.87–5.11)
RDW: 15.2 % (ref 11.5–15.5)
WBC Count: 7.4 K/uL (ref 4.0–10.5)
nRBC: 0 % (ref 0.0–0.2)

## 2024-09-19 LAB — IRON AND TIBC
Iron: 54 ug/dL (ref 28–170)
Saturation Ratios: 19 % (ref 10.4–31.8)
TIBC: 283 ug/dL (ref 250–450)
UIBC: 229 ug/dL

## 2024-09-19 NOTE — Progress Notes (Signed)
 Hematology/Oncology Consult note [facility]  [phone] [phone]  Patient Care Team: Franchot Houston, PA-C as PCP - General (Physician Assistant) Melanee Annah BROCKS, MD as Consulting Physician (Hematology)   Name of the patient: Amy Maxwell  [id number]  07-03-1981   Date of visit: 09/19/24  Diagnosis-iron deficiency anemia History of portal venous thrombosis of unclear etiology s/p 6 months of anticoagulation  Chief complaint/ Reason for visit-routine follow-up of iron deficiency anemia  Heme/Onc history: Patient is a 43 year old female last seen by me in November 2022 for portal venous thrombosis of unclear etiology.  Hypercoagulable workup was negative including protein C protein S Antithrombin III  factor V Leiden prothrombin gene mutation as well as antiphospholipid antibody syndrome and JAK2 mutation testing.  She was on anticoagulation for 6 months and then stopped.   History of iron deficiency anemia secondary to menorrhagia for which she received IV iron.  Patient underwent hysterectomy and bilateral salpingectomy on 09/05/2024.  Pathology was unremarkable  Interval history- Discussed the use of AI scribe software for clinical note transcription with the patient, who gave verbal consent to proceed.  History of Present Illness   Amy Maxwell is a 43 year old female who presents for follow-up after a hysterectomy.  She has experienced a good recovery from the hysterectomy, with early mobility post-surgery. She was able to nap and ambulate around her house shortly after the procedure. By the second day, she increased her activity level and felt her recovery was progressing well.  Prior to the hysterectomy, an EKG indicated an abnormal comment about an infarct, but no acute changes were noted, allowing the surgery to proceed. She is concerned about these EKG findings and has sought clarification from her primary care  physician.  Post-surgery blood work shows a hemoglobin level of 13.5. Her platelet count is currently 450, slightly elevated from previous levels of 426 and 420 a year ago.  She is awaiting her iron levels to determine if further supplementation is needed post-hysterectomy.      ECOG PS- 0 Pain scale- 0   Review of systems- Review of Systems  Constitutional:  Negative for chills, fever, malaise/fatigue and weight loss.  HENT:  Negative for congestion, ear discharge and nosebleeds.   Eyes:  Negative for blurred vision.  Respiratory:  Negative for cough, hemoptysis, sputum production, shortness of breath and wheezing.   Cardiovascular:  Negative for chest pain, palpitations, orthopnea and claudication.  Gastrointestinal:  Negative for abdominal pain, blood in stool, constipation, diarrhea, heartburn, melena, nausea and vomiting.  Genitourinary:  Negative for dysuria, flank pain, frequency, hematuria and urgency.  Musculoskeletal:  Negative for back pain, joint pain and myalgias.  Skin:  Negative for rash.  Neurological:  Negative for dizziness, tingling, focal weakness, seizures, weakness and headaches.  Endo/Heme/Allergies:  Does not bruise/bleed easily.  Psychiatric/Behavioral:  Negative for depression and suicidal ideas. The patient does not have insomnia.       No Known Allergies   Past Medical History:  Diagnosis Date   Anxiety    Arthritis    GERD (gastroesophageal reflux disease)    Hepatomegaly    History of blood transfusion    History of COVID-19    History of methicillin resistant staphylococcus aureus (MRSA) 2012   IDA (iron deficiency anemia)    Lumbar spondylosis    PCOS (polycystic ovarian syndrome)    PONV (postoperative nausea and vomiting)    Portal vein thrombosis 08/2021   a.) unclear etiology; Tx'd with  DOAC x 6 months; consulted with hematology --> hypercoagulable work up NEGATIVE (protein C, protein S, ATIII, factor V Leiden, PT gene mutation,  antiphospholipid antibody syndrome and JAK2 mutation testing)   Tobacco use disorder      Past Surgical History:  Procedure Laterality Date   APPENDECTOMY     CARPAL TUNNEL RELEASE Left    CESAREAN SECTION     x3   CYSTOSCOPY N/A 09/05/2024   Procedure: CYSTOSCOPY;  Surgeon: Verdon Keen, MD;  Location: [facility];  Service: Gynecology;  Laterality: N/A;   HIP ARTHROSCOPY Right 04/03/2024   Procedure: ARTHROSCOPY HIP;  Surgeon: Tobie Priest, MD;  Location: [facility];  Service: Orthopedics;  Laterality: Right;  Right hip arthroscopy, acetabuloplasty, labral repair, femoral osteochondroplasty, capsular closure   HYSTERECTOMY, TOTAL, LAPAROSCOPIC, ROBOT-ASSISTED WITH SALPINGECTOMY Bilateral 09/05/2024   Procedure: HYSTERECTOMY, TOTAL, LAPAROSCOPIC, ROBOT-ASSISTED WITH SALPINGECTOMY;  Surgeon: Verdon Keen, MD;  Location: [facility];  Service: Gynecology;  Laterality: Bilateral;   ROBOTIC ASSISTED LAPAROSCOPIC LYSIS OF ADHESION N/A 09/05/2024   Procedure: LYSIS, ADHESIONS, ROBOT-ASSISTED, LAPAROSCOPIC;  Surgeon: Verdon Keen, MD;  Location: [facility];  Service: Gynecology;  Laterality: N/A;   ROBOTIC ASSISTED LAPAROSCOPIC OVARIAN CYSTECTOMY Bilateral 09/05/2024   Procedure: EXCISION, CYST, OVARY, ROBOT-ASSISTED, LAPAROSCOPIC;  Surgeon: Verdon Keen, MD;  Location: [facility];  Service: Gynecology;  Laterality: Bilateral;   TONSILECTOMY, ADENOIDECTOMY, BILATERAL MYRINGOTOMY AND TUBES     TONSILLECTOMY AND ADENOIDECTOMY     VENTRAL HERNIA REPAIR      Social History   Socioeconomic History   Marital status: Married    Spouse name: Not on file   Number of children: Not on file   Years of education: Not on file   Highest education level: Not on file  Occupational History   Not on file  Tobacco Use   Smoking status: Former    Current packs/day: 0.50    Average packs/day: 0.5 packs/day for 2.0 years (1.0 ttl pk-yrs)    Types: Cigarettes   Smokeless tobacco: Never  Vaping Use    Vaping status: Never Used  Substance and Sexual Activity   Alcohol use: Not Currently   Drug use: Yes    Types: Marijuana   Sexual activity: Not on file  Other Topics Concern   Not on file  Social History Narrative   Not on file   Social Drivers of Health   Financial Resource Strain: Low Risk  (09/18/2024)   Received from [facility]   Overall Financial Resource Strain (CARDIA)    Difficulty of Paying Living Expenses: Not hard at all  Food Insecurity: No Food Insecurity (09/18/2024)   Received from [facility]   Hunger Vital Sign    Within the past 12 months, you worried that your food would run out before you got the money to buy more.: Never true    Within the past 12 months, the food you bought just didn't last and you didn't have money to get more.: Never true  Transportation Needs: No Transportation Needs (09/18/2024)   Received from [facility] - Transportation    In the past 12 months, has lack of transportation kept you from medical appointments or from getting medications?: No    Lack of Transportation (Non-Medical): No  Physical Activity: Not on file  Stress: Not on file  Social Connections: Not on file  Intimate Partner Violence: Not on file    Family History  Problem Relation Age of Onset   Cirrhosis  Father    Alcoholism Father      Current Outpatient Medications:    clindamycin (CLEOCIN T) 1 % SWAB, Apply 1 Application topically 2 (two) times daily., Disp: , Rfl:    Cranberry-Vitamin C (AZO CRANBERRY URINARY TRACT) 250-60 MG CAPS, Take 2 tablets by mouth daily., Disp: , Rfl:    ferrous sulfate 325 (65 FE) MG tablet, Take 325 mg by mouth daily., Disp: , Rfl:    folic acid  (FOLVITE ) 1 MG tablet, Take 1 mg by mouth., Disp: , Rfl:    gabapentin  (NEURONTIN ) 300 MG capsule, Take 300 mg by mouth daily as needed (severe pain.)., Disp: , Rfl:    MAGNESIUM  GLYCINATE PO, Take 200 mg by mouth daily., Disp: ,  Rfl:    methocarbamol (ROBAXIN) 750 MG tablet, Take 750 mg by mouth at bedtime as needed for muscle spasms., Disp: , Rfl:    Multiple Vitamin (MULTIVITAMIN WITH MINERALS) TABS tablet, Take 1 tablet by mouth every Monday, Tuesday, Wednesday, Thursday, and Friday. Women's Multivitamin, Disp: , Rfl:    naproxen  (NAPROSYN ) 500 MG tablet, Take 1 tablet (500 mg total) by mouth 2 (two) times daily with a meal., Disp: 60 tablet, Rfl: 0   omeprazole  (PRILOSEC  OTC) 20 MG tablet, Take 1 tablet (20 mg total) by mouth daily., Disp: 30 tablet, Rfl: 0   predniSONE (DELTASONE) 10 MG tablet, Take by mouth., Disp: , Rfl:    SEMAGLUTIDE-WEIGHT MANAGEMENT [place], Inject 40 Units into the skin every Tuesday., Disp: , Rfl:    spironolactone (ALDACTONE) 100 MG tablet, Take 100 mg by mouth daily., Disp: , Rfl:    traMADol (ULTRAM) 50 MG tablet, Take 50 mg by mouth., Disp: , Rfl:    docusate sodium  (COLACE) 100 MG capsule, Take 1 capsule (100 mg total) by mouth 2 (two) times daily. To keep stools soft (Patient not taking: Reported on 09/19/2024), Disp: 30 capsule, Rfl: 0   etanercept (ENBREL) 50 MG/ML injection, Inject 25 mg into the skin once a week. Haven't started yet., Disp: , Rfl:    gabapentin  (NEURONTIN ) 300 MG capsule, Take 1 capsule (300 mg total) by mouth at bedtime for 14 days. Take nightly for three days after surgery, and then as needed for pain for 14 days., Disp: 14 capsule, Rfl: 0   lactobacillus acidophilus (BACID) TABS tablet, Take 1 tablet by mouth daily. (Patient not taking: Reported on 09/19/2024), Disp: , Rfl:    oxyCODONE  (OXY IR/ROXICODONE ) 5 MG immediate release tablet, Take 1 tablet (5 mg total) by mouth every 4 (four) hours as needed for severe pain (pain score 7-10). (Patient not taking: Reported on 09/19/2024), Disp: 20 tablet, Rfl: 0  Physical exam:  Vitals:   09/19/24 0958  BP: 109/85  Pulse: 81  Resp: 18  Temp: 97.7 F (36.5 C)  TempSrc: Tympanic  SpO2: 99%  Weight: 245 lb 6.4 oz (111.3 kg)   Height: 5' 4 (1.626 m)   Physical Exam Cardiovascular:     Rate and Rhythm: Normal rate and regular rhythm.     Heart sounds: Normal heart sounds.  Pulmonary:     Effort: Pulmonary effort is normal.     Breath sounds: Normal breath sounds.  Skin:    General: Skin is warm and dry.  Neurological:     Mental Status: She is alert and oriented to person, place, and time.      I have personally reviewed labs listed below:    Latest Ref Rng & Units 08/31/2024   11:23 AM  CMP  Glucose 70 - 99 mg/dL 84   BUN 6 - 20 mg/dL 12   Creatinine 9.55 - 1.00 mg/dL 9.37   Sodium 864 - 854 mmol/L 138   Potassium 3.5 - 5.1 mmol/L 3.7   Chloride 98 - 111 mmol/L 102   CO2 22 - 32 mmol/L 23   Calcium 8.9 - 10.3 mg/dL 9.7       Latest Ref Rng & Units 09/19/2024    9:31 AM  CBC  WBC 4.0 - 10.5 K/uL 7.4   Hemoglobin 12.0 - 15.0 g/dL 86.8   Hematocrit 63.9 - 46.0 % 38.1   Platelets 150 - 400 K/uL 450      Assessment and plan- Patient is a 43 y.o. female here for routine follow-up of iron deficiency anemia  Assessment and Plan    Iron deficiency anemia Post-hysterectomy, low likelihood of needing iron supplementation. Hemoglobin at 13.5 indicates good recovery. Awaiting iron levels for confirmation. - Await iron level results. - Consider iron supplementation if levels are low. - Primary care to monitor iron levels in 6-12 months.  Thrombocytosis Platelet count at 450, slightly elevated but stable.  JAK2 mutation testing in the past was negative.  No immediate intervention needed. - Monitor platelet levels. - Contact provider if platelet levels increase.         Visit Diagnosis 1. Iron deficiency anemia, unspecified iron deficiency anemia type      Dr. Annah Skene, MD, MPH [facility] at [facility] [phone] 09/19/2024 12:49 PM

## 2024-09-19 NOTE — Progress Notes (Signed)
 Patient doing well; does have concern regarding recent EKG.

## 2024-09-20 ENCOUNTER — Other Ambulatory Visit: Payer: Self-pay | Admitting: Neurology

## 2024-09-20 ENCOUNTER — Encounter: Payer: Self-pay | Admitting: Neurology

## 2024-09-20 DIAGNOSIS — R5381 Other malaise: Secondary | ICD-10-CM

## 2024-09-24 ENCOUNTER — Other Ambulatory Visit: Payer: Self-pay | Admitting: Neurology

## 2024-09-24 DIAGNOSIS — G5711 Meralgia paresthetica, right lower limb: Secondary | ICD-10-CM

## 2024-09-26 ENCOUNTER — Inpatient Hospital Stay: Admission: RE | Admit: 2024-09-26 | Discharge: 2024-09-26 | Attending: Neurology | Admitting: Neurology

## 2024-09-26 DIAGNOSIS — G5711 Meralgia paresthetica, right lower limb: Secondary | ICD-10-CM

## 2024-10-01 ENCOUNTER — Encounter: Payer: Self-pay | Admitting: Neurology

## 2024-10-01 ENCOUNTER — Other Ambulatory Visit: Payer: Self-pay | Admitting: Neurology

## 2024-10-01 DIAGNOSIS — R5381 Other malaise: Secondary | ICD-10-CM

## 2024-10-05 ENCOUNTER — Encounter: Payer: Self-pay | Admitting: Oncology

## 2024-10-23 ENCOUNTER — Encounter: Payer: Self-pay | Admitting: Oncology
